# Patient Record
Sex: Female | Born: 1990 | Race: Black or African American | Hispanic: No | Marital: Single | Smoking: Current every day smoker
Health system: Southern US, Community
[De-identification: ages and names within clinical notes are randomized; demographics above are authoritative.]

## PROBLEM LIST (undated history)

## (undated) DIAGNOSIS — F329 Major depressive disorder, single episode, unspecified: Secondary | ICD-10-CM

## (undated) DIAGNOSIS — F319 Bipolar disorder, unspecified: Secondary | ICD-10-CM

## (undated) DIAGNOSIS — F419 Anxiety disorder, unspecified: Secondary | ICD-10-CM

## (undated) DIAGNOSIS — T7840XA Allergy, unspecified, initial encounter: Secondary | ICD-10-CM

## (undated) DIAGNOSIS — F32A Depression, unspecified: Secondary | ICD-10-CM

## (undated) DIAGNOSIS — J45909 Unspecified asthma, uncomplicated: Secondary | ICD-10-CM

## (undated) HISTORY — DX: Allergy, unspecified, initial encounter: T78.40XA

## (undated) HISTORY — DX: Depression, unspecified: F32.A

## (undated) HISTORY — DX: Major depressive disorder, single episode, unspecified: F32.9

## (undated) HISTORY — PX: ABDOMINAL SURGERY: SHX537

## (undated) HISTORY — DX: Bipolar disorder, unspecified: F31.9

---
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
## 2009-05-04 ENCOUNTER — Emergency Department (HOSPITAL_COMMUNITY): Admission: EM | Admit: 2009-05-04 | Discharge: 2009-05-04 | Payer: Self-pay | Admitting: Family Medicine

## 2011-01-16 LAB — POCT RAPID STREP A (OFFICE): Streptococcus, Group A Screen (Direct): POSITIVE — AB

## 2011-06-26 ENCOUNTER — Emergency Department (HOSPITAL_COMMUNITY)
Admission: EM | Admit: 2011-06-26 | Discharge: 2011-06-27 | Disposition: A | Discharge status: Home / Self Care | Payer: Self-pay | Attending: Emergency Medicine | Admitting: Emergency Medicine

## 2011-06-26 DIAGNOSIS — L0231 Cutaneous abscess of buttock: Secondary | ICD-10-CM | POA: Insufficient documentation

## 2011-06-26 DIAGNOSIS — L03317 Cellulitis of buttock: Secondary | ICD-10-CM | POA: Insufficient documentation

## 2011-12-16 ENCOUNTER — Encounter (HOSPITAL_COMMUNITY): Payer: Self-pay

## 2011-12-16 ENCOUNTER — Emergency Department (INDEPENDENT_AMBULATORY_CARE_PROVIDER_SITE_OTHER)
Admission: EM | Admit: 2011-12-16 | Discharge: 2011-12-16 | Disposition: A | Discharge status: Home / Self Care | Payer: Self-pay | Source: Home / Self Care

## 2011-12-16 DIAGNOSIS — J019 Acute sinusitis, unspecified: Secondary | ICD-10-CM

## 2011-12-16 DIAGNOSIS — J209 Acute bronchitis, unspecified: Secondary | ICD-10-CM

## 2011-12-16 MED ORDER — AMOXICILLIN 875 MG PO TABS: 875.0000 mg | ORAL_TABLET | Freq: Two times a day (BID) | ORAL | Status: AC

## 2011-12-16 MED ORDER — PROMETHAZINE-CODEINE 6.25-10 MG/5ML PO SYRP: ORAL_SOLUTION | ORAL | Status: AC

## 2011-12-16 NOTE — ED Provider Notes (Signed)
History     CSN: [id number]  Arrival date & time 12/16/11  1529   None     Chief Complaint  Patient presents with  . URI    (Consider location/radiation/quality/duration/timing/severity/associated sxs/prior treatment) HPI Comments: Patient presents today with complaints of sinus pressure, nasal congestion, cough and fever for the last 6 days. She has also had a headache. She has been taking over-the-counter cold medications and using her sister's inhaler with minimal relief. She denies dyspnea or wheezing.   History reviewed. No pertinent past medical history.  History reviewed. No pertinent past surgical history.  History reviewed. No pertinent family history.  History  Substance Use Topics  . Smoking status: Not on file  . Smokeless tobacco: Not on file  . Alcohol Use: Not on file    OB History    Grav Para Term Preterm Abortions TAB SAB Ect Mult Living                  Review of Systems  Constitutional: Positive for fever, chills and fatigue.  HENT: Positive for congestion, rhinorrhea, postnasal drip and sinus pressure. Negative for ear pain, sore throat and sneezing.   Respiratory: Positive for cough. Negative for shortness of breath and wheezing.   Cardiovascular: Negative for chest pain.  Musculoskeletal: Positive for myalgias.  Neurological: Positive for headaches.    Allergies  Review of patient's allergies indicates no known allergies.  Home Medications   Current Outpatient Rx  Name Route Sig Dispense Refill  . AMOXICILLIN 875 MG PO TABS Oral Take 1 tablet (875 mg total) by mouth 2 (two) times daily. 20 tablet 0  . PROMETHAZINE-CODEINE 6.25-10 MG/5ML PO SYRP  1-2 tsp every 6 hrs prn cough 120 mL 0    BP 127/67  Pulse 81  Temp(Src) 98.8 F (37.1 C) (Oral)  Resp 18  SpO2 98%  LMP 12/15/2011  Physical Exam  Nursing note and vitals reviewed. Constitutional: She appears well-developed and well-nourished. No distress.  HENT:  Head: Normocephalic  and atraumatic.  Right Ear: Tympanic membrane, external ear and ear canal normal.  Left Ear: Tympanic membrane, external ear and ear canal normal.  Nose: Mucosal edema ( nasal turbinates severely swollen and erythematous bilaterally) present.  Mouth/Throat: Uvula is midline, oropharynx is clear and moist and mucous membranes are normal. No oropharyngeal exudate, posterior oropharyngeal edema or posterior oropharyngeal erythema.  Neck: Neck supple.  Cardiovascular: Normal rate, regular rhythm and normal heart sounds.   Pulmonary/Chest: Effort normal and breath sounds normal. No respiratory distress.  Lymphadenopathy:    She has no cervical adenopathy.  Neurological: She is alert.  Skin: Skin is warm and dry.  Psychiatric: She has a normal mood and affect.    ED Course  Procedures (including critical care time)  Labs Reviewed - No data to display No results found.   1. Acute sinusitis   2. Acute bronchitis       MDM          Melody Comas, [place] 12/16/11 1738

## 2011-12-16 NOTE — ED Notes (Signed)
C/o URI type symptoms, cough, congestion, body aches

## 2011-12-16 NOTE — Discharge Instructions (Signed)
Increase fluids and rest. Tylenol or Ibuprofen as needed for discomfort. Do not take the prescription cough medication and drive or operate machinery. You may take an over the counter cough medication during daytime hours. Return if symptoms change or worsen.

## 2011-12-17 NOTE — ED Provider Notes (Signed)
Medical screening examination/treatment/procedure(s) were performed by non-physician practitioner and as supervising physician I was immediately available for consultation/collaboration.  Leslee Home, M.D.   Reuben Likes, MD 12/17/11 [phone]

## 2012-02-16 ENCOUNTER — Encounter (HOSPITAL_COMMUNITY): Payer: Self-pay

## 2012-02-16 ENCOUNTER — Emergency Department (HOSPITAL_COMMUNITY)
Admission: EM | Admit: 2012-02-16 | Discharge: 2012-02-16 | Disposition: A | Discharge status: Home / Self Care | Payer: Self-pay | Attending: Emergency Medicine | Admitting: Emergency Medicine

## 2012-02-16 DIAGNOSIS — R220 Localized swelling, mass and lump, head: Secondary | ICD-10-CM | POA: Insufficient documentation

## 2012-02-16 DIAGNOSIS — J029 Acute pharyngitis, unspecified: Secondary | ICD-10-CM | POA: Insufficient documentation

## 2012-02-16 MED ORDER — IBUPROFEN 200 MG PO TABS
600.0000 mg | ORAL_TABLET | Freq: Once | ORAL | Status: AC
  Administered 2012-02-16: 600 mg via ORAL
  Filled 2012-02-16: qty 3

## 2012-02-16 MED ORDER — IBUPROFEN 600 MG PO TABS: 600.0000 mg | ORAL_TABLET | Freq: Four times a day (QID) | ORAL | Status: AC | PRN

## 2012-02-16 NOTE — Discharge Instructions (Signed)
Antibiotic Nonuse  Your caregiver felt that the infection or problem was not one that would be helped with an antibiotic. Infections may be caused by viruses or bacteria. Only a caregiver can tell which one of these is the likely cause of an illness. A cold is the most common cause of infection in both adults and children. A cold is a virus. Antibiotic treatment will have no effect on a viral infection. Viruses can lead to many lost days of work caring for sick children and many missed days of school. Children may catch as many as 10 "colds" or "flus" per year during which they can be tearful, cranky, and uncomfortable. The goal of treating a virus is aimed at keeping the ill person comfortable. Antibiotics are medications used to help the body fight bacterial infections. There are relatively few types of bacteria that cause infections but there are hundreds of viruses. While both viruses and bacteria cause infection they are very different types of germs. A viral infection will typically go away by itself within 7 to 10 days. Bacterial infections may spread or get worse without antibiotic treatment. Examples of bacterial infections are:  Sore throats (like strep throat or tonsillitis).   Infection in the lung (pneumonia).   Ear and skin infections.  Examples of viral infections are:  Colds or flus.   Most coughs and bronchitis.   Sore throats not caused by Strep.   Runny noses.  It is often best not to take an antibiotic when a viral infection is the cause of the problem. Antibiotics can kill off the helpful bacteria that we have inside our body and allow harmful bacteria to start growing. Antibiotics can cause side effects such as allergies, nausea, and diarrhea without helping to improve the symptoms of the viral infection. Additionally, repeated uses of antibiotics can cause bacteria inside of our body to become resistant. That resistance can be passed onto harmful bacterial. The next time  you have an infection it may be harder to treat if antibiotics are used when they are not needed. Not treating with antibiotics allows our own immune system to develop and take care of infections more efficiently. Also, antibiotics will work better for [place] when they are prescribed for bacterial infections. Treatments for a child that is ill may include:  Give extra fluids throughout the day to stay hydrated.   Get plenty of rest.   Only give your child over-the-counter or prescription medicines for pain, discomfort, or fever as directed by your caregiver.   The use of a cool mist humidifier may help stuffy noses.   Cold medications if suggested by your caregiver.  Your caregiver may decide to start you on an antibiotic if:  The problem you were seen for today continues for a longer length of time than expected.   You develop a secondary bacterial infection.  SEEK MEDICAL CARE IF:  Fever lasts longer than 5 days.   Symptoms continue to get worse after 5 to 7 days or become severe.   Difficulty in breathing develops.   Signs of dehydration develop (poor drinking, rare urinating, dark colored urine).   Changes in behavior or worsening tiredness (listlessness or lethargy).  Document Released: 12/05/2001 Document Revised: 09/15/2011 Document Reviewed: 06/03/2009 [facility] Patient Information 2012 [place], [place].  Pharyngitis, Viral and Bacterial Pharyngitis is soreness (inflammation) or infection of the pharynx. It is also called a sore throat. CAUSES  Most sore throats are caused by viruses and are part of a cold. However,  some sore throats are caused by strep and other bacteria. Sore throats can also be caused by post nasal drip from draining sinuses, allergies and sometimes from sleeping with an open mouth. Infectious sore throats can be spread from person to person by coughing, sneezing and sharing cups or eating utensils. TREATMENT  Sore throats that are viral usually last 3-4  days. Viral illness will get better without medications (antibiotics). Strep throat and other bacterial infections will usually begin to get better about 24-48 hours after you begin to take antibiotics. HOME CARE INSTRUCTIONS   If the caregiver feels there is a bacterial infection or if there is a positive strep test, they will prescribe an antibiotic. The full course of antibiotics must be taken. If the full course of antibiotic is not taken, you or your child may become ill again. If you or your child has strep throat and do not finish all of the medication, serious heart or kidney diseases may develop.   Drink enough water and fluids to keep your urine clear or pale yellow.   Only take over-the-counter or prescription medicines for pain, discomfort or fever as directed by your caregiver.   Get lots of rest.   Gargle with salt water ( tsp. of salt in a glass of water) as often as every 1-2 hours as you need for comfort.   Hard candies may soothe the throat if individual is not at risk for choking. Throat sprays or lozenges may also be used.  SEEK MEDICAL CARE IF:   Large, tender lumps in the neck develop.   A rash develops.   Green, yellow-brown or bloody sputum is coughed up.   Your baby is older than 3 months with a rectal temperature of 100.5 F (38.1 C) or higher for more than 1 day.  SEEK IMMEDIATE MEDICAL CARE IF:   A stiff neck develops.   You or your child are drooling or unable to swallow liquids.   You or your child are vomiting, unable to keep medications or liquids down.   You or your child has severe pain, unrelieved with recommended medications.   You or your child are having difficulty breathing (not due to stuffy nose).   You or your child are unable to fully open your mouth.   You or your child develop redness, swelling, or severe pain anywhere on the neck.   You have a fever.   Your baby is older than 3 months with a rectal temperature of 102 F (38.9  C) or higher.   Your baby is 50 months old or younger with a rectal temperature of 100.4 F (38 C) or higher.  MAKE SURE YOU:   Understand these instructions.   Will watch your condition.   Will get help right away if you are not doing well or get worse.  Document Released: 09/26/2005 Document Revised: 09/15/2011 Document Reviewed: 12/24/2007 [facility] Patient Information 2012 [place], [place].  RESOURCE GUIDE  Dental Problems  Patients with Medicaid: [facility] [phone]                                           [phone] Phone:  [phone]  Phone:  [phone]  If unable to pay or uninsured, contact:  [facility] or [facility]. to become qualified for the adult dental clinic.  Chronic Pain Problems Contact [facility]  [phone] Patients need to be referred by their primary care doctor.  Insufficient Money for Medicine Contact [employer] Way:  call "211" or [facility] ([phone].  No Primary Care Doctor Call [facility]  [phone] Other agencies that provide inexpensive medical care    [facility]  [phone]    [facility]  [phone]    [facility]  [phone]    [facility]  [phone]    [facility]  [phone]    [facility]  [phone]  Psychological Services [facility]  [phone] [facility]  [phone] [facility]   [phone] (emergency services [phone])  Abuse/Neglect [facility] Child Abuse Hotline [phone] [facility] Child Abuse Hotline 2072265223 (After Hours)  Emergency Shelter [facility] [phone]  [facility] [phone] [facility] [phone]  MRSA Hotline #:   726 556 9065    [facility]  Resources  [facility]  [employer]                           [facility] Dept. [address] [place]                     [address]         [address]  Blondell Reveal Phone:  [phone]                                  Phone:  [phone]                   Phone:  [phone]  [facility] Phone:  [phone]  [facility] Child Abuse Hotline 215-396-6652 [phone] (After Hours)

## 2012-02-16 NOTE — ED Notes (Signed)
Patient states onset one month ago seen Doctor placed on antibiotics for cough sore throat states not feeling better recently sore throat 6/10 headache 6/10 throbbing. Airway intact bilateral equal chest rise and fall. No distress noted.

## 2012-02-16 NOTE — ED Notes (Signed)
Sore throat, cough congested,  And headache,  Also can't breathe at night,

## 2012-02-16 NOTE — ED Provider Notes (Signed)
History   This chart was scribed for Forbes Cellar, MD by Melba Coon. The patient was seen in room STRE5/STRE5 and the patient's care was started at 11:15PM.    CSN: [id number]  Arrival date & time 02/16/12  1051   First MD Initiated Contact with Patient 02/16/12 1102      Chief Complaint  Patient presents with  . Sore Throat    (Consider location/radiation/quality/duration/timing/severity/associated sxs/prior treatment) HPI Brandi Ball is a 21 y.o. female who presents to the Emergency Department complaining of constant, moderate to severe sore throat with an onset 3 days ago. Pt states that it hurts to swallow; had a fever of 101 yesterday but none today. No OTC meds taken at home. Had similar symptoms last month; was given abx and was fine. At night, nasal congestion and cough present. Mild frontal HA present. No neck pain, rash, back pain, SOB, abd pain, n/v/d, dysuria, or extremity pain, edema, weakness, numbness, or tingling. No known allergies. No other pertinent medical symptoms.   History reviewed. No pertinent past medical history.  Past Surgical History  Procedure Date  . Abdominal surgery     No family history on file.  History  Substance Use Topics  . Smoking status: Current Everyday Smoker  . Smokeless tobacco: Not on file  . Alcohol Use: Yes    OB History    Grav Para Term Preterm Abortions TAB SAB Ect Mult Living                  Review of Systems 10 Systems reviewed and all are negative for acute change except as noted in the HPI.   Allergies  Review of patient's allergies indicates no known allergies.  Home Medications  No current outpatient prescriptions on file.  BP 111/72  Pulse 89  Temp(Src) 98.1 F (36.7 C) (Oral)  Resp 12  SpO2 100%  LMP 02/06/2012  Physical Exam  Nursing note and vitals reviewed. Constitutional: She is oriented to person, place, and time. She appears well-developed and well-nourished. No distress.  HENT:    Head: Normocephalic and atraumatic.       1+ tonsillar swelling with mild erythema; no trismus No exudates Uvula midline  Eyes: EOM are normal.  Neck: Neck supple. No tracheal deviation present.  Cardiovascular: Normal rate.   No murmur heard. Pulmonary/Chest: Effort normal. No respiratory distress. She has no wheezes. She has no rales.  Abdominal: Soft. There is no tenderness.  Musculoskeletal: Normal range of motion. She exhibits no tenderness.  Lymphadenopathy:    She has no cervical adenopathy.  Neurological: She is alert and oriented to person, place, and time.  Skin: Skin is warm and dry. No erythema.  Psychiatric: She has a normal mood and affect. Her behavior is normal.    ED Course  Procedures (including critical care time)  DIAGNOSTIC STUDIES: Oxygen Saturation is 100% on room air, normal by my interpretation.    COORDINATION OF CARE:  11:19PM - EDMD will order ibuprofen and strep test for the pt; EDMD also took a sample of back of pt throat and will order culture.     Labs Reviewed  RAPID STREP SCREEN  STREP B DNA PROBE   No results found.   1. Pharyngitis       MDM  Pharyngitis without apparent complications. Rapid strep negative. Sent for culture. Ibuprofen. Tolerating PO. No EMC precluding discharge at this time. Given Precautions for return. PMD f/u.  I personally performed the services described in this documentation,  which was scribed in my presence. The recorded information has been reviewed and considered.        Forbes Cellar, MD 02/16/12 [phone]

## 2013-05-08 ENCOUNTER — Encounter (HOSPITAL_COMMUNITY): Payer: Self-pay | Admitting: Emergency Medicine

## 2013-05-08 ENCOUNTER — Emergency Department (INDEPENDENT_AMBULATORY_CARE_PROVIDER_SITE_OTHER)
Admission: EM | Admit: 2013-05-08 | Discharge: 2013-05-08 | Disposition: A | Discharge status: Home / Self Care | Payer: Self-pay | Source: Home / Self Care | Attending: Emergency Medicine | Admitting: Emergency Medicine

## 2013-05-08 DIAGNOSIS — S335XXA Sprain of ligaments of lumbar spine, initial encounter: Secondary | ICD-10-CM

## 2013-05-08 LAB — POCT URINALYSIS DIP (DEVICE)
Bilirubin Urine: NEGATIVE
Specific Gravity, Urine: 1.02 (ref 1.005–1.030)
pH: 7 (ref 5.0–8.0)

## 2013-05-08 MED ORDER — HYDROCODONE-IBUPROFEN 7.5-200 MG PO TABS: 1.0000 | ORAL_TABLET | Freq: Three times a day (TID) | ORAL | Status: DC | PRN

## 2013-05-08 MED ORDER — CYCLOBENZAPRINE HCL 10 MG PO TABS: 10.0000 mg | ORAL_TABLET | Freq: Three times a day (TID) | ORAL | Status: DC | PRN

## 2013-05-08 MED ORDER — HYDROCODONE-ACETAMINOPHEN 5-325 MG PO TABS
ORAL_TABLET | ORAL | Status: AC
  Filled 2013-05-08: qty 1

## 2013-05-08 MED ORDER — HYDROCODONE-ACETAMINOPHEN 5-325 MG PO TABS
1.0000 | ORAL_TABLET | Freq: Once | ORAL | Status: AC
  Administered 2013-05-08: 1 via ORAL

## 2013-05-08 NOTE — ED Provider Notes (Signed)
CSN: [id number]     Arrival date & time 05/08/13  1237 History     First MD Initiated Contact with Patient 05/08/13 1335     Chief Complaint  Patient presents with  . Back Pain   (Consider location/radiation/quality/duration/timing/severity/associated sxs/prior Treatment) HPI Comments: Patient presents urgent care this afternoon complaining that since Sunday she's been expressing lower back pain that somewhat shoots up to her middle back. She woke up with this pain Sunday denies any recent injury or trauma such as recent falls or increased physical activity. She does however describes it she used to be a runner and has experienced similar pains in the past. For the last 2 days she's been taking ibuprofen, as well as Aleve with no significant improvement. Pain is exacerbated with activity movements especially leaning forward.  Patient denies any numbness or tingling sensations whenever lower extremities. Denies any changes in her urination or bowel movement patterns. Denies constitutional symptoms such as fevers, abdominal pain or unintentional weight loss.  Patient is a 22 y.o. female presenting with back pain. The history is provided by the patient.  Back Pain Location:  Lumbar spine Quality:  Aching Radiates to:  Does not radiate Pain severity:  Moderate Pain is:  Same all the time Onset quality:  Gradual Timing:  Constant Progression:  Worsening Chronicity:  New Context: not recent illness and not recent injury   Relieved by:  Nothing Worsened by:  Movement, standing, palpation and touching Ineffective treatments:  None tried Associated symptoms: no abdominal pain, no bladder incontinence, no dysuria, no fever, no headaches, no leg pain, no numbness, no paresthesias, no pelvic pain, no perianal numbness, no tingling, no weakness and no weight loss     History reviewed. No pertinent past medical history. Past Surgical History  Procedure Laterality Date  . Abdominal surgery      History reviewed. No pertinent family history. History  Substance Use Topics  . Smoking status: Current Every Day Smoker  . Smokeless tobacco: Not on file  . Alcohol Use: Yes   OB History   Grav Para Term Preterm Abortions TAB SAB Ect Mult Living                 Review of Systems  Constitutional: Positive for activity change. Negative for fever, chills, weight loss, appetite change and fatigue.  Gastrointestinal: Negative for abdominal pain.  Genitourinary: Negative for bladder incontinence, dysuria, flank pain and pelvic pain.  Musculoskeletal: Positive for back pain. Negative for myalgias, joint swelling, arthralgias and gait problem.  Skin: Negative for color change, rash and wound.  Neurological: Negative for tingling, weakness, numbness, headaches and paresthesias.    Allergies  Review of patient's allergies indicates no known allergies.  Home Medications   Current Outpatient Rx  Name  Route  Sig  Dispense  Refill  . cyclobenzaprine (FLEXERIL) 10 MG tablet   Oral   Take 1 tablet (10 mg total) by mouth 3 (three) times daily as needed for muscle spasms.   20 tablet   0   . HYDROcodone-ibuprofen (VICOPROFEN) 7.5-200 MG per tablet   Oral   Take 1 tablet by mouth every 8 (eight) hours as needed for pain.   15 tablet   0    BP 113/72  Pulse 78  Temp(Src) 97.9 F (36.6 C) (Oral)  Resp 16  SpO2 100%  LMP 04/28/2013 Physical Exam  Nursing note and vitals reviewed. Constitutional: Vital signs are normal. She appears well-developed and well-nourished.  Non-toxic appearance. She does  not have a sickly appearance. She does not appear ill. No distress.  HENT:  Head: Normocephalic.  Eyes: No scleral icterus.  Neck: Neck supple.  Pulmonary/Chest: Effort normal and breath sounds normal.  Musculoskeletal: She exhibits tenderness.       Lumbar back: She exhibits decreased range of motion, tenderness and pain. She exhibits no bony tenderness, no swelling, no edema, no  deformity and no laceration.       Back:  Neurological: She is alert.  Skin: No rash noted. No erythema.    ED Course   Procedures (including critical care time)  Labs Reviewed  POCT URINALYSIS DIP (DEVICE) - Abnormal; Notable for the following:    Hgb urine dipstick TRACE (*)    Leukocytes, UA TRACE (*)    All other components within normal limits  POCT PREGNANCY, URINE   No results found. 1. Lumbar back sprain, initial encounter     MDM  Lumbar sprain/strain. (  Non-trauma)  Plan of care:  Patient has been encouraged to take the prescribed medicines for the next 3-5 days including a muscle relaxer. She has been also instructed to stretch her back with provided written exercises. Have discussed with patient what specific symptoms should wire for further evaluation in the emergency department as well as discussed that if her pain persisted beyond 7-10 days or worsens that she should return for further evaluation or followup with the orthopedic Dr. She agrees with treatment plan and followup care. A work note was provided for the next 48 hours with some restrictions for the next 72 hours  New Prescriptions   CYCLOBENZAPRINE (FLEXERIL) 10 MG TABLET    Take 1 tablet (10 mg total) by mouth 3 (three) times daily as needed for muscle spasms.   HYDROCODONE-IBUPROFEN (VICOPROFEN) 7.5-200 MG PER TABLET    Take 1 tablet by mouth every 8 (eight) hours as needed for pain.  .edp  Jimmie Molly, MD 05/08/13 1430

## 2013-05-08 NOTE — ED Notes (Signed)
Patient states she has lower back pain radiating to mid back since Sunday.  Pain medications taking and stretch as a therapy but no relief.  Patient states that she had back pain while running track at school but no major pain like she is in right now.

## 2013-05-08 NOTE — ED Notes (Signed)
Patient states she does have someone to take her home

## 2013-07-19 ENCOUNTER — Emergency Department (INDEPENDENT_AMBULATORY_CARE_PROVIDER_SITE_OTHER)
Admission: EM | Admit: 2013-07-19 | Discharge: 2013-07-19 | Disposition: A | Discharge status: Home / Self Care | Payer: Self-pay | Source: Home / Self Care | Attending: Family Medicine | Admitting: Family Medicine

## 2013-07-19 ENCOUNTER — Encounter (HOSPITAL_COMMUNITY): Payer: Self-pay | Admitting: Emergency Medicine

## 2013-07-19 DIAGNOSIS — K0889 Other specified disorders of teeth and supporting structures: Secondary | ICD-10-CM

## 2013-07-19 DIAGNOSIS — K089 Disorder of teeth and supporting structures, unspecified: Secondary | ICD-10-CM

## 2013-07-19 MED ORDER — DICLOFENAC POTASSIUM 50 MG PO TABS: 50.0000 mg | ORAL_TABLET | Freq: Three times a day (TID) | ORAL | Status: DC

## 2013-07-19 MED ORDER — CLINDAMYCIN HCL 150 MG PO CAPS: 150.0000 mg | ORAL_CAPSULE | Freq: Four times a day (QID) | ORAL | Status: DC

## 2013-07-19 NOTE — ED Notes (Signed)
Triage process interrupted by department acuity

## 2013-07-19 NOTE — ED Provider Notes (Signed)
CSN: [id number]     Arrival date & time 07/19/13  0845 History   First MD Initiated Contact with Patient 07/19/13 [phone]     Chief Complaint  Patient presents with  . Dental Pain   (Consider location/radiation/quality/duration/timing/severity/associated sxs/prior Treatment) Patient is a 22 y.o. female presenting with tooth pain. The history is provided by the patient.  Dental Pain Location:  Upper Upper teeth location:  15/LU 2nd molar Quality:  Aching and sharp Severity:  Mild Onset quality:  Gradual Duration:  3 days Progression:  Worsening Chronicity:  New Context: dental caries and poor dentition   Relieved by:  None tried Worsened by:  Nothing tried Ineffective treatments:  None tried Associated symptoms: facial pain   Associated symptoms: no facial swelling and no fever   Risk factors: lack of dental care     History reviewed. No pertinent past medical history. Past Surgical History  Procedure Laterality Date  . Abdominal surgery     No family history on file. History  Substance Use Topics  . Smoking status: Current Every Day Smoker  . Smokeless tobacco: Not on file  . Alcohol Use: Yes   OB History   Grav Para Term Preterm Abortions TAB SAB Ect Mult Living                 Review of Systems  Constitutional: Negative.  Negative for fever.  HENT: Positive for dental problem. Negative for facial swelling.     Allergies  Review of patient's allergies indicates no known allergies.  Home Medications   Current Outpatient Rx  Name  Route  Sig  Dispense  Refill  . clindamycin (CLEOCIN) 150 MG capsule   Oral   Take 1 capsule (150 mg total) by mouth 4 (four) times daily.   28 capsule   0   . cyclobenzaprine (FLEXERIL) 10 MG tablet   Oral   Take 1 tablet (10 mg total) by mouth 3 (three) times daily as needed for muscle spasms.   20 tablet   0   . diclofenac (CATAFLAM) 50 MG tablet   Oral   Take 1 tablet (50 mg total) by mouth 3 (three) times daily. For  dental pain   15 tablet   0   . HYDROcodone-ibuprofen (VICOPROFEN) 7.5-200 MG per tablet   Oral   Take 1 tablet by mouth every 8 (eight) hours as needed for pain.   15 tablet   0    BP 117/63  Pulse 67  Temp(Src) 98.5 F (36.9 C) (Oral)  Resp 18  SpO2 100%  LMP 06/28/2013 Physical Exam  Nursing note and vitals reviewed. Constitutional: She appears well-developed and well-nourished.  HENT:  Right Ear: External ear normal.  Left Ear: External ear normal.  Mouth/Throat: Oropharynx is clear and moist.      ED Course  Procedures (including critical care time) Labs Review Labs Reviewed - No data to display Imaging Review No results found.  EKG Interpretation     Ventricular Rate:    PR Interval:    QRS Duration:   QT Interval:    QTC Calculation:   R Axis:     Text Interpretation:              MDM      Linna Hoff, MD 07/19/13 [phone]

## 2013-07-19 NOTE — ED Notes (Signed)
Left top tooth has a hole in tooth, swelling and pain

## 2013-07-19 NOTE — ED Notes (Signed)
Discussed [facility], [facility]

## 2014-06-06 ENCOUNTER — Emergency Department (HOSPITAL_COMMUNITY): Payer: Self-pay

## 2014-06-06 ENCOUNTER — Encounter (HOSPITAL_COMMUNITY): Payer: Self-pay | Admitting: Emergency Medicine

## 2014-06-06 ENCOUNTER — Emergency Department (HOSPITAL_COMMUNITY)
Admission: EM | Admit: 2014-06-06 | Discharge: 2014-06-06 | Disposition: A | Discharge status: Home / Self Care | Payer: Self-pay | Attending: Emergency Medicine | Admitting: Emergency Medicine

## 2014-06-06 DIAGNOSIS — R05 Cough: Secondary | ICD-10-CM

## 2014-06-06 DIAGNOSIS — F17200 Nicotine dependence, unspecified, uncomplicated: Secondary | ICD-10-CM | POA: Insufficient documentation

## 2014-06-06 DIAGNOSIS — Z79899 Other long term (current) drug therapy: Secondary | ICD-10-CM | POA: Insufficient documentation

## 2014-06-06 DIAGNOSIS — Z792 Long term (current) use of antibiotics: Secondary | ICD-10-CM | POA: Insufficient documentation

## 2014-06-06 DIAGNOSIS — Z3202 Encounter for pregnancy test, result negative: Secondary | ICD-10-CM | POA: Insufficient documentation

## 2014-06-06 DIAGNOSIS — J069 Acute upper respiratory infection, unspecified: Secondary | ICD-10-CM | POA: Diagnosis present

## 2014-06-06 DIAGNOSIS — R059 Cough, unspecified: Secondary | ICD-10-CM | POA: Diagnosis present

## 2014-06-06 DIAGNOSIS — R509 Fever, unspecified: Secondary | ICD-10-CM | POA: Insufficient documentation

## 2014-06-06 DIAGNOSIS — R079 Chest pain, unspecified: Secondary | ICD-10-CM | POA: Insufficient documentation

## 2014-06-06 LAB — BASIC METABOLIC PANEL
Anion gap: 12 (ref 5–15)
BUN: 9 mg/dL (ref 6–23)
CHLORIDE: 103 meq/L (ref 96–112)
CO2: 24 meq/L (ref 19–32)
Calcium: 9.6 mg/dL (ref 8.4–10.5)
Creatinine, Ser: 0.91 mg/dL (ref 0.50–1.10)
GFR, EST NON AFRICAN AMERICAN: 87 mL/min — AB (ref >90)
GLUCOSE: 112 mg/dL — AB (ref 70–99)
POTASSIUM: 3.6 meq/L — AB (ref 3.7–5.3)
SODIUM: 139 meq/L (ref 137–147)

## 2014-06-06 LAB — CBC
HCT: 35.9 % — ABNORMAL LOW (ref 36.0–46.0)
Hemoglobin: 12 g/dL (ref 12.0–15.0)
MCH: 28.8 pg (ref 26.0–34.0)
MCHC: 33.4 g/dL (ref 30.0–36.0)
MCV: 86.1 fL (ref 78.0–100.0)
Platelets: 246 10*3/uL (ref 150–400)
RBC: 4.17 MIL/uL (ref 3.87–5.11)
RDW: 14.4 % (ref 11.5–15.5)
WBC: 5.9 10*3/uL (ref 4.0–10.5)

## 2014-06-06 LAB — I-STAT TROPONIN, ED: Troponin i, poc: 0 ng/mL (ref 0.00–0.08)

## 2014-06-06 LAB — POC URINE PREG, ED: Preg Test, Ur: NEGATIVE

## 2014-06-06 MED ORDER — BENZONATATE 100 MG PO CAPS
100.0000 mg | ORAL_CAPSULE | Freq: Once | ORAL | Status: AC
  Administered 2014-06-06: 100 mg via ORAL
  Filled 2014-06-06: qty 1

## 2014-06-06 MED ORDER — BENZONATATE 100 MG PO CAPS: 100.0000 mg | ORAL_CAPSULE | Freq: Three times a day (TID) | ORAL | Status: DC

## 2014-06-06 MED ORDER — HYDROCOD POLST-CHLORPHEN POLST 10-8 MG/5ML PO LQCR: 5.0000 mL | Freq: Every evening | ORAL | Status: DC | PRN

## 2014-06-06 MED ORDER — BENZONATATE 100 MG PO CAPS
100.0000 mg | ORAL_CAPSULE | Freq: Once | ORAL | Status: DC
  Filled 2014-06-06: qty 1

## 2014-06-06 NOTE — ED Provider Notes (Signed)
I have personally seen and examined the patient.  I have discussed the plan of care with the resident.  I have reviewed the documentation on PMH/FH/Soc. History.  I have reviewed the documentation of the resident and agree.  Pt well appearing, no distress, lung sounds clear, appropriate for d/c home Pt agreeable with plan   Joya Gaskins, MD 06/06/14 [phone]

## 2014-06-06 NOTE — ED Notes (Signed)
Pt. Reports cough for 3 weeks with chest pain, SOB and nausea and fever (103 at home).

## 2014-06-06 NOTE — Discharge Instructions (Signed)
Cough, Adult   A cough is a reflex. It helps you clear your throat and airways. A cough can help heal your body. A cough can last 2 or 3 weeks (acute) or may last more than 8 weeks (chronic). Some common causes of a cough can include an infection, allergy, or a cold.  HOME CARE  · Only take medicine as told by your doctor.  · If given, take your medicines (antibiotics) as told. Finish them even if you start to feel better.  · Use a cold steam vaporizer or humidifier in your home. This can help loosen thick spit (secretions).  · Sleep so you are almost sitting up (semi-upright). Use pillows to do this. This helps reduce coughing.  · Rest as needed.  · Stop smoking if you smoke.  GET HELP RIGHT AWAY IF:  · You have yellowish-white fluid (pus) in your thick spit.  · Your cough gets worse.  · Your medicine does not reduce coughing, and you are losing sleep.  · You cough up blood.  · You have trouble breathing.  · Your pain gets worse and medicine does not help.  · You have a fever.  MAKE SURE YOU:   · Understand these instructions.  · Will watch your condition.  · Will get help right away if you are not doing well or get worse.  Document Released: 06/09/2011 Document Revised: 02/10/2014 Document Reviewed: 06/09/2011  ExitCare® Patient Information ©2015 [employer]. This information is not intended to replace advice given to you by your health care provider. Make sure you discuss any questions you have with your health care provider.

## 2014-06-06 NOTE — ED Notes (Signed)
EKG completed in triage.

## 2014-06-06 NOTE — ED Notes (Signed)
Patient discharged with all personal belongings. 

## 2014-06-06 NOTE — ED Provider Notes (Signed)
CSN: [id number]     Arrival date & time 06/06/14  1505 History   First MD Initiated Contact with Patient 06/06/14 1838     Chief Complaint  Patient presents with  . Cough  . Chest Pain     (Consider location/radiation/quality/duration/timing/severity/associated sxs/prior Treatment) Patient is a 23 y.o. female presenting with cough. The history is provided by the patient.  Cough Cough characteristics:  Non-productive Severity:  Severe Onset quality:  Gradual Duration:  3 weeks Timing:  Constant Progression:  Worsening Chronicity:  New Smoker: yes   Context: upper respiratory infection   Context: not animal exposure   Relieved by:  Nothing Worsened by:  Nothing tried Ineffective treatments:  Cough suppressants Associated symptoms: fever, rhinorrhea and sinus congestion   Associated symptoms: no chest pain, no chills, no diaphoresis, no ear fullness, no ear pain, no eye discharge, no headaches, no myalgias, no shortness of breath, no sore throat and no wheezing     History reviewed. No pertinent past medical history. Past Surgical History  Procedure Laterality Date  . Abdominal surgery     No family history on file. History  Substance Use Topics  . Smoking status: Current Every Day Smoker  . Smokeless tobacco: Not on file  . Alcohol Use: Yes   OB History   Grav Para Term Preterm Abortions TAB SAB Ect Mult Living                 Review of Systems  Constitutional: Positive for fever. Negative for chills, diaphoresis, activity change and appetite change.  HENT: Positive for rhinorrhea. Negative for drooling, ear pain, sneezing, sore throat and trouble swallowing.   Eyes: Negative for discharge and redness.  Respiratory: Positive for cough. Negative for chest tightness, shortness of breath, wheezing and stridor.   Cardiovascular: Negative for chest pain and leg swelling.  Gastrointestinal: Negative for nausea, vomiting, abdominal pain, diarrhea, constipation and blood in  stool.  Genitourinary: Negative for difficulty urinating.  Musculoskeletal: Negative for arthralgias and myalgias.  Skin: Negative for pallor.  Neurological: Negative for dizziness, syncope, speech difficulty, weakness, light-headedness and headaches.  Hematological: Negative for adenopathy. Does not bruise/bleed easily.  Psychiatric/Behavioral: Negative for confusion and agitation.      Allergies  Review of patient's allergies indicates no known allergies.  Home Medications   Prior to Admission medications   Medication Sig Start Date End Date Taking? Authorizing Provider  benzonatate (TESSALON) 100 MG capsule Take 1 capsule (100 mg total) by mouth every 8 (eight) hours. 06/06/14   Sena Hitch, MD  chlorpheniramine-HYDROcodone [facility] PENNKINETIC ER) 10-8 MG/5ML LQCR Take 5 mLs by mouth at bedtime as needed for cough. 06/06/14   Sena Hitch, MD  clindamycin (CLEOCIN) 150 MG capsule Take 1 capsule (150 mg total) by mouth 4 (four) times daily. 07/19/13   Linna Hoff, MD  cyclobenzaprine (FLEXERIL) 10 MG tablet Take 1 tablet (10 mg total) by mouth 3 (three) times daily as needed for muscle spasms. 05/08/13   Jimmie Molly, MD  diclofenac (CATAFLAM) 50 MG tablet Take 1 tablet (50 mg total) by mouth 3 (three) times daily. For dental pain 07/19/13   Linna Hoff, MD  HYDROcodone-ibuprofen (VICOPROFEN) 7.5-200 MG per tablet Take 1 tablet by mouth every 8 (eight) hours as needed for pain. 05/08/13   Jimmie Molly, MD   BP 111/57  Pulse 72  Temp(Src) 98.7 F (37.1 C) (Oral)  Resp 18  SpO2 100%  LMP 05/13/2014 Physical Exam  Constitutional: She is oriented to  person, place, and time. She appears well-developed and well-nourished. No distress.  HENT:  Head: Normocephalic and atraumatic.  Right Ear: External ear normal.  Left Ear: External ear normal.  Eyes: Conjunctivae and EOM are normal. Right eye exhibits no discharge. Left eye exhibits no discharge.  Neck: Normal range of motion.  Neck supple. No JVD present.  Cardiovascular: Normal rate, regular rhythm and normal heart sounds.  Exam reveals no gallop and no friction rub.   No murmur heard. Pulmonary/Chest: Effort normal and breath sounds normal. No stridor. No respiratory distress. She has no wheezes. She has no rales. She exhibits no tenderness.  Abdominal: Soft. Bowel sounds are normal. She exhibits no distension. There is no tenderness. There is no rebound and no guarding.  Musculoskeletal: Normal range of motion. She exhibits no edema.  Neurological: She is alert and oriented to person, place, and time.  Skin: Skin is warm. No rash noted. She is not diaphoretic.  Psychiatric: She has a normal mood and affect. Her behavior is normal.    ED Course  Procedures (including critical care time) Labs Review Labs Reviewed  CBC - Abnormal; Notable for the following:    HCT 35.9 (*)    All other components within normal limits  BASIC METABOLIC PANEL - Abnormal; Notable for the following:    Potassium 3.6 (*)    Glucose, Bld 112 (*)    GFR calc non Af Amer 87 (*)    All other components within normal limits  POC URINE PREG, ED  Rosezena Sensor, ED    Imaging Review Dg Chest 2 View  06/06/2014   CLINICAL DATA:  Three weeks of cough  EXAM: CHEST  2 VIEW  COMPARISON:  02/27/2007  FINDINGS: The heart size and mediastinal contours are within normal limits. Both lungs are clear. The visualized skeletal structures are unremarkable.  IMPRESSION: No active cardiopulmonary disease.   Electronically Signed   By: Alcide Clever M.D.   On: 06/06/2014 16:19     EKG Interpretation   Date/Time:  Friday June 06 2014 15:17:36 EDT Ventricular Rate:  78 PR Interval:  146 QRS Duration: 80 QT Interval:  336 QTC Calculation: 383 R Axis:   79 Text Interpretation:  Sinus bradycardia Non-specific ST-t changes artifact  noted No previous ECGs available Confirmed by Bebe Shaggy  MD, Dorinda Hill ([id number])  on 06/06/2014 7:40:58 PM      MDM    Final diagnoses:  URI (upper respiratory infection)  Cough    Pt presents with URI and cough. Non productive dry cough. No fever. Not on estrogen. No plane/car rides. No family history of VTE. No chest pain or SOB. Well low risk. PERC neg. Oropharynx clear. FROM of the neck. Uvula midline. Doubt PTA or RPA. Likely URI. No sick contacts. No children or exposures to potential pertussis. Supportive care at home. Gave tessalon here. Gave rx for tessalon and Tussionex. Strong return precautions given for worsening symptoms or any other alarming or concerning symptoms or issues. The patient was in agreement with the treatment plan and I answered all of their questions. The patient was stable for dc. At dc, the patient ambulated without difficulty, was moving all four extremities, symptoms improved, NAD. and AOx4 Care discussed with my attending, Dr. Bebe Shaggy. If performed and available, imaging studies and labs reviewed.    Sena Hitch, MD 06/06/14 2015

## 2015-03-29 ENCOUNTER — Inpatient Hospital Stay (HOSPITAL_COMMUNITY)
Admission: AD | Admit: 2015-03-29 | Discharge: 2015-03-29 | Disposition: A | Discharge status: Home / Self Care | Payer: Self-pay | Source: Ambulatory Visit | Attending: Obstetrics & Gynecology | Admitting: Obstetrics & Gynecology

## 2015-03-29 ENCOUNTER — Encounter (HOSPITAL_COMMUNITY): Payer: Self-pay

## 2015-03-29 DIAGNOSIS — IMO0001 Reserved for inherently not codable concepts without codable children: Secondary | ICD-10-CM

## 2015-03-29 DIAGNOSIS — F419 Anxiety disorder, unspecified: Secondary | ICD-10-CM | POA: Insufficient documentation

## 2015-03-29 DIAGNOSIS — F17210 Nicotine dependence, cigarettes, uncomplicated: Secondary | ICD-10-CM | POA: Insufficient documentation

## 2015-03-29 DIAGNOSIS — N911 Secondary amenorrhea: Secondary | ICD-10-CM | POA: Insufficient documentation

## 2015-03-29 DIAGNOSIS — R030 Elevated blood-pressure reading, without diagnosis of hypertension: Secondary | ICD-10-CM | POA: Insufficient documentation

## 2015-03-29 HISTORY — DX: Unspecified asthma, uncomplicated: J45.909

## 2015-03-29 LAB — URINALYSIS, ROUTINE W REFLEX MICROSCOPIC
Bilirubin Urine: NEGATIVE
GLUCOSE, UA: NEGATIVE mg/dL
Ketones, ur: NEGATIVE mg/dL
LEUKOCYTES UA: NEGATIVE
Nitrite: NEGATIVE
PROTEIN: NEGATIVE mg/dL
SPECIFIC GRAVITY, URINE: 1.02 (ref 1.005–1.030)
UROBILINOGEN UA: 0.2 mg/dL (ref 0.0–1.0)
pH: 7 (ref 5.0–8.0)

## 2015-03-29 LAB — WET PREP, GENITAL
Trich, Wet Prep: NONE SEEN
Yeast Wet Prep HPF POC: NONE SEEN

## 2015-03-29 LAB — URINE MICROSCOPIC-ADD ON

## 2015-03-29 LAB — TSH: TSH: 0.901 u[IU]/mL (ref 0.350–4.500)

## 2015-03-29 LAB — POCT PREGNANCY, URINE: PREG TEST UR: NEGATIVE

## 2015-03-29 NOTE — Discharge Instructions (Signed)
Secondary Amenorrhea  Secondary amenorrhea is the stopping of menstrual flow for 3-6 months in a female who has previously had periods. There are many possible causes. Most of these causes are not serious. Usually, treating the underlying problem causing the loss of menses will return your periods to normal. CAUSES  Some common and uncommon causes of not menstruating include:  Malnutrition.  Low blood sugar (hypoglycemia).  Polycystic ovary disease.  Stress or fear.  Breastfeeding.  Hormone imbalance.  Ovarian failure.  Medicines.  Extreme obesity.  Cystic fibrosis.  Low body weight or drastic weight reduction from any cause.  Early menopause.  Removal of ovaries or uterus.  Contraceptives.  Illness.  Long-term (chronic) illnesses.  Cushing syndrome.  Thyroid problems.  Birth control pills, patches, or vaginal rings for birth control. RISK FACTORS You may be at greater risk of secondary amenorrhea if:  You have a family history of this condition.  You have an eating disorder.  You do athletic training. DIAGNOSIS  A diagnosis is made by your health care provider taking a medical history and doing a physical exam. This will include a pelvic exam to check for problems with your reproductive organs. Pregnancy must be ruled out. Often, numerous blood tests are done to measure different hormones in the body. Urine testing may be done. Specialized exams (ultrasound, CT scan, MRI, or hysteroscopy) may have to be done as well as measuring the body mass index (BMI). TREATMENT  Treatment depends on the cause of the amenorrhea. If an eating disorder is present, this can be treated with an adequate diet and therapy. Chronic illnesses may improve with treatment of the illness. Amenorrhea may be corrected with medicines, lifestyle changes, or surgery. If the amenorrhea cannot be corrected, it is sometimes possible to create a false menstruation with medicines. HOME CARE  INSTRUCTIONS  Maintain a healthy diet.  Manage weight problems.  Exercise regularly but not excessively.  Get adequate sleep.  Manage stress.  Be aware of changes in your menstrual cycle. Keep a record of when your periods occur. Note the date your period starts, how long it lasts, and any problems. SEEK MEDICAL CARE IF: Your symptoms do not get better with treatment. Document Released: 11/07/2006 Document Revised: 05/29/2013 Document Reviewed: 03/14/2013 ExitCare Patient Information 2015 [employer]. This information is not intended to replace advice given to you by your health care provider. Make sure you discuss any questions you have with your health care provider.  

## 2015-03-29 NOTE — MAU Note (Signed)
Pt presents to MAU with complaints of irregular cycles. Had a normal cycle in January and hasnt had one since. Home pregnany test negative

## 2015-03-29 NOTE — MAU Provider Note (Signed)
History     CSN: [id number]  Arrival date and time: 03/29/15 1053   First Provider Initiated Contact with Patient 03/29/15 1210      Chief Complaint  Patient presents with  . Metrorrhagia   HPI Comments: Brandi Ball is a 24 y.o. Nulligravida presenting with secondary amenorrhea of 5 months duration. LMP 10/24/2014. States she had always had regular menses at monthly intervals prior to that. No history abnormal bleeding. Single sex partner for several months. Denies malodorous or irritative vaginal discharge but gives history of frequent Trichomonas in the past. Does not want to start contraception. Anxiety/depression worse the last few days. Medications include Latuda which was started 08/2014. Did not take in March. Denies any history of chronic hypertension.       Past Medical History  Diagnosis Date  . Asthma     childhood  Denies abnormal Pap or GYN conditions  Past Surgical History  Procedure Laterality Date  . Abdominal surgery      History reviewed. No pertinent family history.  History  Substance Use Topics  . Smoking status: Current Every Day Smoker  . Smokeless tobacco: Not on file  . Alcohol Use: Yes    Allergies: No Known Allergies  Prescriptions prior to admission  Medication Sig Dispense Refill Last Dose  . CALCIUM PO Take 1 tablet by mouth daily.   Past Week at Unknown time  . fluticasone (FLONASE) 50 MCG/ACT nasal spray Place 1 spray into both nostrils daily.   Past Month at Unknown time  . lurasidone (LATUDA) 40 MG TABS tablet Take 40 mg by mouth daily with breakfast.   03/28/2015 at Unknown time  . NAPROXEN PO Take 1-2 tablets by mouth daily as needed (migraines).   03/28/2015 at Unknown time  . pseudoephedrine-acetaminophen (TYLENOL SINUS) 30-500 MG TABS Take 1 tablet by mouth every 4 (four) hours as needed (allergies).   Past Week at Unknown time  . Vitamin D, Ergocalciferol, (DRISDOL) 50000 UNITS CAPS capsule Take 50,000 Units by mouth every 7  (seven) days.   Past Week at Unknown time  . benzonatate (TESSALON) 100 MG capsule Take 1 capsule (100 mg total) by mouth every 8 (eight) hours. (Patient not taking: Reported on 03/29/2015) 21 capsule 0   . chlorpheniramine-HYDROcodone (TUSSIONEX PENNKINETIC ER) 10-8 MG/5ML LQCR Take 5 mLs by mouth at bedtime as needed for cough. (Patient not taking: Reported on 03/29/2015) 115 mL 0   . clindamycin (CLEOCIN) 150 MG capsule Take 1 capsule (150 mg total) by mouth 4 (four) times daily. (Patient not taking: Reported on 03/29/2015) 28 capsule 0   . cyclobenzaprine (FLEXERIL) 10 MG tablet Take 1 tablet (10 mg total) by mouth 3 (three) times daily as needed for muscle spasms. (Patient not taking: Reported on 03/29/2015) 20 tablet 0 Unknown at Unknown time  . diclofenac (CATAFLAM) 50 MG tablet Take 1 tablet (50 mg total) by mouth 3 (three) times daily. For dental pain (Patient not taking: Reported on 03/29/2015) 15 tablet 0   . HYDROcodone-ibuprofen (VICOPROFEN) 7.5-200 MG per tablet Take 1 tablet by mouth every 8 (eight) hours as needed for pain. (Patient not taking: Reported on 03/29/2015) 15 tablet 0 Unknown at Unknown time    Review of Systems  Constitutional: Negative for fever.  HENT: Negative for hearing loss.        Denies cold temperature intolerance, neck mass.  Eyes: Positive for blurred vision. Negative for double vision.  Respiratory: Negative for cough.   Cardiovascular: Negative for chest pain.  Gastrointestinal: Negative for nausea, vomiting, abdominal pain and blood in stool.       Had an incidence of fleeting sharp suprapubic abdominal pain 5 days ago and last night. Having pain at present.  Genitourinary: Negative for dysuria, urgency, frequency, hematuria and flank pain.  Musculoskeletal: Negative for myalgias, back pain and neck pain.  Neurological: Negative for headaches.  Psychiatric/Behavioral: Positive for depression. Negative for suicidal ideas and substance abuse. The patient is  nervous/anxious.    Physical Exam   Blood pressure 146/78, pulse 88, temperature 98.2 F (36.8 C), resp. rate 18, height 5\' 4"  (1.626 m), weight 93.895 kg (207 lb), last menstrual period 10/24/2014.  Physical Exam  Nursing note and vitals reviewed. Constitutional: She is oriented to person, place, and time.  Mildly obese  Neck: Normal range of motion. Neck supple. No tracheal deviation present. No thyromegaly present.  Genitourinary: Uterus normal. Vaginal discharge found.  Moderate white homogenous discharge  Musculoskeletal: Normal range of motion.  Neurological: She is alert and oriented to person, place, and time.  Skin: Skin is warm and dry.  No hirsutism noted    MAU Course  Procedures Results for orders placed or performed during the hospital encounter of 03/29/15 (from the past 24 hour(s))  Urinalysis, Routine w reflex microscopic (not at [facility])     Status: Abnormal   Collection Time: 03/29/15 11:00 AM  Result Value Ref Range   Color, Urine YELLOW YELLOW   APPearance CLEAR CLEAR   Specific Gravity, Urine 1.020 1.005 - 1.030   pH 7.0 5.0 - 8.0   Glucose, UA NEGATIVE NEGATIVE mg/dL   Hgb urine dipstick MODERATE (A) NEGATIVE   Bilirubin Urine NEGATIVE NEGATIVE   Ketones, ur NEGATIVE NEGATIVE mg/dL   Protein, ur NEGATIVE NEGATIVE mg/dL   Urobilinogen, UA 0.2 0.0 - 1.0 mg/dL   Nitrite NEGATIVE NEGATIVE   Leukocytes, UA NEGATIVE NEGATIVE  Urine microscopic-add on     Status: Abnormal   Collection Time: 03/29/15 11:00 AM  Result Value Ref Range   Squamous Epithelial / LPF FEW (A) RARE   WBC, UA 0-2 <3 WBC/hpf   RBC / HPF 0-2 <3 RBC/hpf   Bacteria, UA FEW (A) RARE   Urine-Other MUCOUS PRESENT   Pregnancy, urine POC     Status: None   Collection Time: 03/29/15 11:23 AM  Result Value Ref Range   Preg Test, Ur NEGATIVE NEGATIVE  Wet prep, genital     Status: Abnormal   Collection Time: 03/29/15 12:20 PM  Result Value Ref Range   Yeast Wet Prep HPF POC NONE SEEN NONE  SEEN   Trich, Wet Prep NONE SEEN NONE SEEN   Clue Cells Wet Prep HPF POC FEW (A) NONE SEEN   WBC, Wet Prep HPF POC FEW (A) NONE SEEN  GC/CT, HIV, TSH, prolactin sent   Assessment and Plan   1. Secondary amenorrhea   2. Elevated BP    Follow-up Information    Follow up with [facility].   Specialty:  Obstetrics and Gynecology   Why:  Someone from Clinic will call you with appt.   Contact information:   [address] [place] [address] [phone]      POE,DEIRDRE 03/29/2015, 12:32 PM

## 2015-03-30 LAB — PROLACTIN: Prolactin: 21 ng/mL (ref 4.8–23.3)

## 2015-03-30 LAB — HIV ANTIBODY (ROUTINE TESTING W REFLEX): HIV Screen 4th Generation wRfx: NONREACTIVE

## 2015-03-30 LAB — GC/CHLAMYDIA PROBE AMP (~~LOC~~) NOT AT ARMC
Chlamydia: NEGATIVE
NEISSERIA GONORRHEA: NEGATIVE

## 2015-04-27 ENCOUNTER — Encounter: Payer: Self-pay | Admitting: Obstetrics & Gynecology

## 2015-04-27 ENCOUNTER — Ambulatory Visit (INDEPENDENT_AMBULATORY_CARE_PROVIDER_SITE_OTHER): Payer: Self-pay | Admitting: Obstetrics & Gynecology

## 2015-04-27 VITALS — BP 130/71 | HR 78 | Temp 98.9°F | Ht 64.0 in | Wt 207.7 lb

## 2015-04-27 DIAGNOSIS — N926 Irregular menstruation, unspecified: Secondary | ICD-10-CM | POA: Insufficient documentation

## 2015-04-27 MED ORDER — MEDROXYPROGESTERONE ACETATE 10 MG PO TABS: 10.0000 mg | ORAL_TABLET | Freq: Every day | ORAL | Status: DC

## 2015-04-27 NOTE — Patient Instructions (Signed)
Secondary Amenorrhea  Secondary amenorrhea is the stopping of menstrual flow for 3-6 months in a female who has previously had periods. There are many possible causes. Most of these causes are not serious. Usually, treating the underlying problem causing the loss of menses will return your periods to normal. CAUSES  Some common and uncommon causes of not menstruating include:  Malnutrition.  Low blood sugar (hypoglycemia).  Polycystic ovary disease.  Stress or fear.  Breastfeeding.  Hormone imbalance.  Ovarian failure.  Medicines.  Extreme obesity.  Cystic fibrosis.  Low body weight or drastic weight reduction from any cause.  Early menopause.  Removal of ovaries or uterus.  Contraceptives.  Illness.  Long-term (chronic) illnesses.  Cushing syndrome.  Thyroid problems.  Birth control pills, patches, or vaginal rings for birth control. RISK FACTORS You may be at greater risk of secondary amenorrhea if:  You have a family history of this condition.  You have an eating disorder.  You do athletic training. DIAGNOSIS  A diagnosis is made by your health care provider taking a medical history and doing a physical exam. This will include a pelvic exam to check for problems with your reproductive organs. Pregnancy must be ruled out. Often, numerous blood tests are done to measure different hormones in the body. Urine testing may be done. Specialized exams (ultrasound, CT scan, MRI, or hysteroscopy) may have to be done as well as measuring the body mass index (BMI). TREATMENT  Treatment depends on the cause of the amenorrhea. If an eating disorder is present, this can be treated with an adequate diet and therapy. Chronic illnesses may improve with treatment of the illness. Amenorrhea may be corrected with medicines, lifestyle changes, or surgery. If the amenorrhea cannot be corrected, it is sometimes possible to create a false menstruation with medicines. HOME CARE  INSTRUCTIONS  Maintain a healthy diet.  Manage weight problems.  Exercise regularly but not excessively.  Get adequate sleep.  Manage stress.  Be aware of changes in your menstrual cycle. Keep a record of when your periods occur. Note the date your period starts, how long it lasts, and any problems. SEEK MEDICAL CARE IF: Your symptoms do not get better with treatment. Document Released: 11/07/2006 Document Revised: 05/29/2013 Document Reviewed: 03/14/2013 ExitCare Patient Information 2015 [employer]. This information is not intended to replace advice given to you by your health care provider. Make sure you discuss any questions you have with your health care provider.  

## 2015-04-27 NOTE — Progress Notes (Signed)
Patient ID: Brandi BuergerDaphnie Ball, female   DOB: [birth year], 24 y.o.   MRN: [id number]  Chief Complaint  Patient presents with  . Amenorrhea    last period in January  cc:no menses for 5 mo, LMP 04/05/15  HPI Brandi Ball is a 24 y.o. female.  G0P0000 LMP 04/05/15. Seen in MAU 6/15 with amenorrhea since Jan, had spontaneous menses after that. Not seeking pregnancy in same sex relationship. Previous regular menses. Stopped Latuda 2 mo ago   HPI  Past Medical History  Diagnosis Date  . Asthma     childhood  . Bipolar 1 disorder   . Depression     Past Surgical History  Procedure Laterality Date  . Abdominal surgery      No family history on file.  Social History History  Substance Use Topics  . Smoking status: Current Every Day Smoker  . Smokeless tobacco: Never Used  . Alcohol Use: Yes    No Known Allergies  Current Outpatient Prescriptions  Medication Sig Dispense Refill  . medroxyPROGESTERone (PROVERA) 10 MG tablet Take 1 tablet (10 mg total) by mouth daily. Use for ten days 10 tablet 2   No current facility-administered medications for this visit.    Review of Systems Review of Systems  Constitutional: Positive for unexpected weight change (30 lb weight gain 12 mo).  Respiratory: Negative.   Genitourinary: Negative for vaginal bleeding, vaginal discharge, menstrual problem and pelvic pain.    Blood pressure 130/71, pulse 78, temperature 98.9 F (37.2 C), temperature source Oral, height 5\' 4"  (1.626 m), weight 207 lb 11.2 oz (94.212 kg), last menstrual period 10/24/2014. Body mass index is 35.63 kg/(m^2).  Physical Exam Physical Exam  Constitutional: Brandi Ball is oriented to person, place, and time. Brandi Ball appears well-developed. No distress.  Cardiovascular: Normal rate.   Pulmonary/Chest: Effort normal. No respiratory distress.  Neurological: Brandi Ball is alert and oriented to person, place, and time.  Psychiatric: Brandi Ball has a normal mood and affect. Her behavior is normal.   Vitals reviewed.   Data Reviewed MAU note  Assessment    Irregular menses suspect anovulation Obesity     Plan    Advise wgt loss Provera challenge rx sent for PRN use is menses few weeks late, RTC 6 mo Prn    20 min face to face and coordination of care    ARNOLD,JAMES 04/27/2015, 4:44 PM

## 2015-09-07 ENCOUNTER — Encounter (HOSPITAL_COMMUNITY): Payer: Self-pay

## 2015-09-07 ENCOUNTER — Inpatient Hospital Stay (HOSPITAL_COMMUNITY)
Admission: AD | Admit: 2015-09-07 | Discharge: 2015-09-07 | Disposition: A | Discharge status: Home / Self Care | Payer: Self-pay | Source: Ambulatory Visit | Attending: Family Medicine | Admitting: Family Medicine

## 2015-09-07 DIAGNOSIS — B9689 Other specified bacterial agents as the cause of diseases classified elsewhere: Secondary | ICD-10-CM

## 2015-09-07 DIAGNOSIS — N83201 Unspecified ovarian cyst, right side: Secondary | ICD-10-CM

## 2015-09-07 DIAGNOSIS — Z7982 Long term (current) use of aspirin: Secondary | ICD-10-CM | POA: Insufficient documentation

## 2015-09-07 DIAGNOSIS — N83209 Unspecified ovarian cyst, unspecified side: Secondary | ICD-10-CM | POA: Insufficient documentation

## 2015-09-07 DIAGNOSIS — N760 Acute vaginitis: Secondary | ICD-10-CM | POA: Insufficient documentation

## 2015-09-07 DIAGNOSIS — A499 Bacterial infection, unspecified: Secondary | ICD-10-CM

## 2015-09-07 LAB — CBC
HEMATOCRIT: 38.8 % (ref 36.0–46.0)
Hemoglobin: 12.9 g/dL (ref 12.0–15.0)
MCH: 28.8 pg (ref 26.0–34.0)
MCHC: 33.2 g/dL (ref 30.0–36.0)
MCV: 86.6 fL (ref 78.0–100.0)
PLATELETS: 274 10*3/uL (ref 150–400)
RBC: 4.48 MIL/uL (ref 3.87–5.11)
RDW: 14 % (ref 11.5–15.5)
WBC: 7.4 10*3/uL (ref 4.0–10.5)

## 2015-09-07 LAB — URINE MICROSCOPIC-ADD ON: Bacteria, UA: NONE SEEN

## 2015-09-07 LAB — URINALYSIS, ROUTINE W REFLEX MICROSCOPIC
Bilirubin Urine: NEGATIVE
Glucose, UA: NEGATIVE mg/dL
KETONES UR: NEGATIVE mg/dL
Leukocytes, UA: NEGATIVE
Nitrite: NEGATIVE
PROTEIN: NEGATIVE mg/dL
Specific Gravity, Urine: 1.025 (ref 1.005–1.030)
pH: 6 (ref 5.0–8.0)

## 2015-09-07 LAB — WET PREP, GENITAL
Sperm: NONE SEEN
Trich, Wet Prep: NONE SEEN
Yeast Wet Prep HPF POC: NONE SEEN

## 2015-09-07 LAB — POCT PREGNANCY, URINE: Preg Test, Ur: NEGATIVE

## 2015-09-07 MED ORDER — METRONIDAZOLE 500 MG PO TABS: 500.0000 mg | ORAL_TABLET | Freq: Three times a day (TID) | ORAL | Status: DC

## 2015-09-07 NOTE — MAU Note (Signed)
Pt C/O severe lower abd pain, also back & vaginal pain that started on 11/26.  Had spotting also on the 26th, became heavier the next day.

## 2015-09-07 NOTE — MAU Provider Note (Signed)
  History    G1P0010 in with c/o low abd pain since Saturday. 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 Also started menses over the weekend. States uses condoms as birth control. CSN: [id number]  Arrival date and time: 09/07/15 1234   None     Chief Complaint  Patient presents with  . Abdominal Pain  . Back Pain  . Vaginal Bleeding   HPI  OB History    Gravida Para Term Preterm AB TAB SAB Ectopic Multiple Living   1 0 0 0 1 0 1 0 0 0       Past Medical History  Diagnosis Date  . Asthma     childhood  . Bipolar 1 disorder (HCC)   . Depression     Past Surgical History  Procedure Laterality Date  . Abdominal surgery      History reviewed. No pertinent family history.  Social History  Substance Use Topics  . Smoking status: Current Every Day Smoker  . Smokeless tobacco: Never Used  . Alcohol Use: Yes    Allergies: No Known Allergies  Prescriptions prior to admission  Medication Sig Dispense Refill Last Dose  . aspirin-acetaminophen-caffeine (EXCEDRIN MIGRAINE) 250-250-65 MG tablet Take 2 tablets by mouth every 6 (six) hours as needed for headache (pain).   09/06/2015 at Unknown time  . Multiple Vitamins-Minerals (MULTIVITAMIN PO) Take 1 tablet by mouth daily.   09/06/2015 at Unknown time  . medroxyPROGESTERone (PROVERA) 10 MG tablet Take 1 tablet (10 mg total) by mouth daily. Use for ten days (Patient not taking: Reported on 09/07/2015) 10 tablet 2     Review of Systems  Constitutional: Negative.   HENT: Negative.   Eyes: Negative.   Respiratory: Negative.   Cardiovascular: Negative.   Gastrointestinal: Positive for abdominal pain.  Genitourinary: Negative.   Musculoskeletal: Negative.   Skin: Negative.   Neurological: Negative.   Endo/Heme/Allergies: Negative.   Psychiatric/Behavioral: Negative.    Physical Exam    Blood pressure 135/78, pulse 79, temperature 98 F (36.7 C), temperature source Oral, resp. rate 18, last menstrual period 09/06/2015.  Physical Exam  Constitutional: She is oriented to person, place, and time. She appears well-developed and well-nourished.  HENT:  Head: Normocephalic.  Eyes: Pupils are equal, round, and reactive to light.  Neck: Normal range of motion.  Cardiovascular: Normal rate, regular rhythm, normal heart sounds and intact distal pulses.   Respiratory: Effort normal and breath sounds normal.  GI: Soft. Bowel sounds are normal. There is tenderness.  Genitourinary: Vagina normal and uterus normal.  Musculoskeletal: Normal range of motion.  Neurological: She is alert and oriented to person, place, and time. She has normal reflexes.  Skin: Skin is warm and dry.  Psychiatric: She has a normal mood and affect. Her behavior is normal. Judgment and thought content normal.    MAU Course  Procedures  MDM Ovarian cyst BV  Assessment and Plan  Sterile spec exam done, cultures and wet wet obtrained. Sl tenderness with bumanual exam.rupt ovarian cyst. Wet prep shows BV. Will treat with flagyl declines ocps to control ovarian cust. .Sherolyn Trettin DARLENE 09/07/2015, 3:54 PM

## 2015-09-07 NOTE — Discharge Instructions (Signed)

## 2015-09-08 LAB — HIV ANTIBODY (ROUTINE TESTING W REFLEX): HIV SCREEN 4TH GENERATION: NONREACTIVE

## 2015-09-08 LAB — GC/CHLAMYDIA PROBE AMP (~~LOC~~) NOT AT ARMC
Chlamydia: NEGATIVE
Neisseria Gonorrhea: NEGATIVE

## 2015-09-08 LAB — RPR: RPR: NONREACTIVE

## 2016-06-23 ENCOUNTER — Encounter (HOSPITAL_COMMUNITY): Payer: Self-pay

## 2016-06-23 ENCOUNTER — Emergency Department (HOSPITAL_COMMUNITY)
Admission: EM | Admit: 2016-06-23 | Discharge: 2016-06-23 | Disposition: A | Discharge status: Home / Self Care | Payer: BLUE CROSS/BLUE SHIELD | Attending: Emergency Medicine | Admitting: Emergency Medicine

## 2016-06-23 DIAGNOSIS — T7840XA Allergy, unspecified, initial encounter: Secondary | ICD-10-CM | POA: Diagnosis present

## 2016-06-23 DIAGNOSIS — J45909 Unspecified asthma, uncomplicated: Secondary | ICD-10-CM | POA: Insufficient documentation

## 2016-06-23 DIAGNOSIS — L500 Allergic urticaria: Secondary | ICD-10-CM | POA: Diagnosis not present

## 2016-06-23 DIAGNOSIS — F17210 Nicotine dependence, cigarettes, uncomplicated: Secondary | ICD-10-CM | POA: Diagnosis not present

## 2016-06-23 DIAGNOSIS — Z79899 Other long term (current) drug therapy: Secondary | ICD-10-CM | POA: Diagnosis not present

## 2016-06-23 DIAGNOSIS — Z7982 Long term (current) use of aspirin: Secondary | ICD-10-CM | POA: Diagnosis not present

## 2016-06-23 MED ORDER — FAMOTIDINE 20 MG PO TABS: 20.0000 mg | ORAL_TABLET | Freq: Two times a day (BID) | ORAL | 0 refills | Status: DC

## 2016-06-23 MED ORDER — HYDROXYZINE HCL 25 MG PO TABS
25.0000 mg | ORAL_TABLET | Freq: Every evening | ORAL | 0 refills | Status: DC | PRN
Start: 2016-06-23 — End: 2016-07-24

## 2016-06-23 MED ORDER — DIPHENHYDRAMINE HCL 25 MG PO TABS: 25.0000 mg | ORAL_TABLET | Freq: Four times a day (QID) | ORAL | 0 refills | Status: DC

## 2016-06-23 MED ORDER — DEXAMETHASONE SODIUM PHOSPHATE 10 MG/ML IJ SOLN
10.0000 mg | Freq: Once | INTRAMUSCULAR | Status: AC
  Administered 2016-06-23: 10 mg via INTRAMUSCULAR
  Filled 2016-06-23: qty 1

## 2016-06-23 MED ORDER — ALBUTEROL SULFATE HFA 108 (90 BASE) MCG/ACT IN AERS: 2.0000 | INHALATION_SPRAY | RESPIRATORY_TRACT | 1 refills | Status: AC | PRN

## 2016-06-23 NOTE — ED Triage Notes (Signed)
Woke up scratching, hives all over around 9:15 pm last night. Benadryl was taken 50mg , with no relieve Airway patent,

## 2016-06-23 NOTE — ED Provider Notes (Signed)
[facility]-EMERGENCY DEPT Provider Note   CSN: [id number] Arrival date & time: 06/23/16  1152  By signing my name below, I, Placido SouLogan Joldersma, attest that this documentation has been prepared under the direction and in the presence of Ramel Tobon C. Neysha Criado, PA-C. Electronically Signed: Placido SouLogan Joldersma, ED Scribe. 06/23/16. 1:02 PM.   History   Chief Complaint Chief Complaint  Patient presents with  . Allergic Reaction    HPI HPI Comments: Brandi Ball is a 25 y.o. female who presents to the Emergency Department complaining of worsening, mild, diffuse rash to her hands and feet which began last night around 9:00 PM. She states her symptoms progressively worsened and spread across her legs, torso and back. She reports associated burning/itchiness and mild wheezing but states she wheezes at baseline and doesn't feel as if it has worsened since the onset of her rash. Pt took two benadryl this morning which provided mild relief. Pt denies having visualized any insects prior to her rash or any recent contacts with similar symptoms. Pt reports a h/o asthma but states she hasn't used an inhaler or experienced severe exacerbations since she was a child. No other associated symptoms at this time.   The history is provided by the patient. No language interpreter was used.    Past Medical History:  Diagnosis Date  . Asthma    childhood  . Bipolar 1 disorder (HCC)   . Depression     Patient Active Problem List   Diagnosis Date Noted  . Irregular menstrual cycle 04/27/2015  . Cough 06/06/2014  . URI (upper respiratory infection) 06/06/2014    Past Surgical History:  Procedure Laterality Date  . ABDOMINAL SURGERY      OB History    Gravida Para Term Preterm AB Living   1 0 0 0 1 0   SAB TAB Ectopic Multiple Live Births   1 0 0 0         Home Medications    Prior to Admission medications   Medication Sig Start Date End Date Taking? Authorizing Provider  albuterol (PROVENTIL HFA;VENTOLIN HFA)  108 (90 Base) MCG/ACT inhaler Inhale 2 puffs into the lungs every 4 (four) hours as needed for wheezing or shortness of breath. 06/23/16   Kielee Care C Izick Gasbarro, PA-C  aspirin-acetaminophen-caffeine (EXCEDRIN MIGRAINE) [phone] MG tablet Take 2 tablets by mouth every 6 (six) hours as needed for headache (pain).    Historical Provider, MD  diphenhydrAMINE (BENADRYL) 25 MG tablet Take 1 tablet (25 mg total) by mouth every 6 (six) hours. 06/23/16 06/26/16  Arvell Pulsifer C Hutson Luft, PA-C  famotidine (PEPCID) 20 MG tablet Take 1 tablet (20 mg total) by mouth 2 (two) times daily. 06/23/16 06/28/16  Deric Bocock C Willene Holian, PA-C  hydrOXYzine (ATARAX/VISTARIL) 25 MG tablet Take 1 tablet (25 mg total) by mouth at bedtime as needed for itching. 06/23/16   Matthieu Loftus C Roseland Braun, PA-C  metroNIDAZOLE (FLAGYL) 500 MG tablet Take 1 tablet (500 mg total) by mouth 3 (three) times daily. 09/07/15   Montez MoritaMarie D Lawson, CNM  Multiple Vitamins-Minerals (MULTIVITAMIN PO) Take 1 tablet by mouth daily.    Historical Provider, MD    Family History No family history on file.  Social History Social History  Substance Use Topics  . Smoking status: Current Every Day Smoker    Packs/day: 0.50    Types: Cigarettes  . Smokeless tobacco: Never Used  . Alcohol use Yes     Allergies   Review of patient's allergies indicates no known allergies.   Review  of Systems Review of Systems  Respiratory: Positive for wheezing (present at baseline). Negative for shortness of breath.   Skin: Positive for color change and rash.  All other systems reviewed and are negative.  Physical Exam Updated Vital Signs BP 114/58 (BP Location: Right Arm)   Pulse 80   Temp 98.8 F (37.1 C)   Resp 18   LMP 06/02/2016   SpO2 100%   Physical Exam  Constitutional: She appears well-developed and well-nourished. No distress.  HENT:  Head: Normocephalic and atraumatic.  Eyes: Conjunctivae are normal.  Neck: Neck supple.  Cardiovascular: Normal rate, regular rhythm, normal heart sounds  and intact distal pulses.   Pulmonary/Chest: Effort normal. No respiratory distress. She has wheezes.  No difficulty speaking or breathing. Faint wheezes noted in the bilateral lower lobes. Pt confirms this is baseline for her.   Abdominal: Soft. There is no tenderness. There is no guarding.  Musculoskeletal: She exhibits no edema or tenderness.  Lymphadenopathy:    She has no cervical adenopathy.  Neurological: She is alert.  Skin: Skin is warm and dry. Rash noted. She is not diaphoretic. There is erythema.  Hives covering both arms, back and legs.   Psychiatric: She has a normal mood and affect. Her behavior is normal.  Nursing note and vitals reviewed.  ED Treatments / Results  Labs (all labs ordered are listed, but only abnormal results are displayed) Labs Reviewed - No data to display  EKG  EKG Interpretation None       Radiology No results found.  Procedures Procedures  DIAGNOSTIC STUDIES: Oxygen Saturation is 100% on RA, normal by my interpretation.    COORDINATION OF CARE: 12:59 PM PM Discussed next steps with pt. Pt verbalized understanding and is agreeable with the plan.    Medications Ordered in ED Medications  dexamethasone (DECADRON) injection 10 mg (10 mg Intramuscular Given 06/23/16 1315)     Initial Impression / Assessment and Plan / ED Course  I have reviewed the triage vital signs and the nursing notes.  Pertinent labs & imaging results that were available during my care of the patient were reviewed by me and considered in my medical decision making (see chart for details).  Clinical Course   Patient with signs of allergic reaction. No physical deep breathing or acute distress. Appropriate treatment and return precautions discussed.   Vitals:   06/23/16 1214 06/23/16 1320  BP:  114/58  Pulse: 84 80  Resp: 18 18  Temp: 98.8 F (37.1 C)   SpO2: 100% 100%      Final Clinical Impressions(s) / ED Diagnoses   Final diagnoses:  Allergic  reaction, initial encounter    New Prescriptions New Prescriptions   ALBUTEROL (PROVENTIL HFA;VENTOLIN HFA) 108 (90 BASE) MCG/ACT INHALER    Inhale 2 puffs into the lungs every 4 (four) hours as needed for wheezing or shortness of breath.   DIPHENHYDRAMINE (BENADRYL) 25 MG TABLET    Take 1 tablet (25 mg total) by mouth every 6 (six) hours.   FAMOTIDINE (PEPCID) 20 MG TABLET    Take 1 tablet (20 mg total) by mouth 2 (two) times daily.   HYDROXYZINE (ATARAX/VISTARIL) 25 MG TABLET    Take 1 tablet (25 mg total) by mouth at bedtime as needed for itching.     Anselm Pancoast, PA-C 06/23/16 1321    Lavera Guise, MD 06/23/16 2035

## 2016-06-23 NOTE — Discharge Instructions (Signed)
Your symptoms seem to be caused by possible allergic reaction. Take 25 mg of Benadryl every 6 hours for the next 3 days. Take 20 mg of Pepcid twice a day for the next 5 days. Use the albuterol inhaler as needed for wheezing. Take the hydroxyzine at night as needed for itching. Use caution with the Benadryl and the hydroxyzine as these can cause drowsiness. Return to the ED should symptoms worsen. Follow-up with an allergist to figure out what could have caused this reaction.

## 2016-07-24 ENCOUNTER — Inpatient Hospital Stay (HOSPITAL_COMMUNITY)
Admission: AD | Admit: 2016-07-24 | Discharge: 2016-07-24 | Disposition: A | Discharge status: Home / Self Care | Payer: BLUE CROSS/BLUE SHIELD | Source: Ambulatory Visit | Attending: Obstetrics and Gynecology | Admitting: Obstetrics and Gynecology

## 2016-07-24 ENCOUNTER — Encounter (HOSPITAL_COMMUNITY): Payer: Self-pay | Admitting: *Deleted

## 2016-07-24 DIAGNOSIS — N390 Urinary tract infection, site not specified: Secondary | ICD-10-CM | POA: Insufficient documentation

## 2016-07-24 DIAGNOSIS — F17210 Nicotine dependence, cigarettes, uncomplicated: Secondary | ICD-10-CM | POA: Insufficient documentation

## 2016-07-24 DIAGNOSIS — N3001 Acute cystitis with hematuria: Secondary | ICD-10-CM | POA: Diagnosis not present

## 2016-07-24 DIAGNOSIS — R319 Hematuria, unspecified: Secondary | ICD-10-CM | POA: Diagnosis present

## 2016-07-24 DIAGNOSIS — Z7982 Long term (current) use of aspirin: Secondary | ICD-10-CM | POA: Insufficient documentation

## 2016-07-24 LAB — POCT PREGNANCY, URINE: PREG TEST UR: NEGATIVE

## 2016-07-24 LAB — URINALYSIS, ROUTINE W REFLEX MICROSCOPIC
Bilirubin Urine: NEGATIVE
GLUCOSE, UA: NEGATIVE mg/dL
Ketones, ur: NEGATIVE mg/dL
Nitrite: NEGATIVE
PROTEIN: 100 mg/dL — AB
Specific Gravity, Urine: >1.03 — ABNORMAL HIGH (ref 1.005–1.030)
pH: 6 (ref 5.0–8.0)

## 2016-07-24 LAB — WET PREP, GENITAL
Clue Cells Wet Prep HPF POC: NONE SEEN
Sperm: NONE SEEN
TRICH WET PREP: NONE SEEN
Yeast Wet Prep HPF POC: NONE SEEN

## 2016-07-24 LAB — URINE MICROSCOPIC-ADD ON

## 2016-07-24 MED ORDER — NITROFURANTOIN MONOHYD MACRO 100 MG PO CAPS: 100.0000 mg | ORAL_CAPSULE | Freq: Two times a day (BID) | ORAL | 0 refills | Status: DC

## 2016-07-24 NOTE — Discharge Instructions (Signed)

## 2016-07-24 NOTE — MAU Note (Signed)
Pt reports difficulty urinating and blood in urine tonight. Also reports urinary frequency.

## 2016-07-24 NOTE — MAU Provider Note (Signed)
History   [id number]   Chief Complaint  Patient presents with  . Hematuria    HPI Brandi Ball is a 25 y.o. female  G1P0010 here with report of urinary frequency and blood tonight.  +dysuria x 2 days.  Denies fever, body aches, or chills.  No report of flank pain.  +white vaginal discharge two days ago, without itching or odor.    Patient's last menstrual period was 06/30/2016.  OB History  Gravida Para Term Preterm AB Living  1 0 0 0 1 0  SAB TAB Ectopic Multiple Live Births  1 0 0 0      # Outcome Date GA Lbr Len/2nd Weight Sex Delivery Anes PTL Lv  1 SAB               Past Medical History:  Diagnosis Date  . Asthma    childhood  . Bipolar 1 disorder (HCC)   . Depression     No family history on file.  Social History   Social History  . Marital status: Single    Spouse name: N/A  . Number of children: N/A  . Years of education: N/A   Social History Main Topics  . Smoking status: Current Every Day Smoker    Packs/day: 0.50    Types: Cigarettes  . Smokeless tobacco: Never Used  . Alcohol use Yes  . Drug use: No  . Sexual activity: Yes    Birth control/ protection: None   Other Topics Concern  . None   Social History Narrative  . None    No Known Allergies  No current facility-administered medications on file prior to encounter.    Current Outpatient Prescriptions on File Prior to Encounter  Medication Sig Dispense Refill  . albuterol (PROVENTIL HFA;VENTOLIN HFA) 108 (90 Base) MCG/ACT inhaler Inhale 2 puffs into the lungs every 4 (four) hours as needed for wheezing or shortness of breath. 1 Inhaler 1  . aspirin-acetaminophen-caffeine (EXCEDRIN MIGRAINE) 250-250-65 MG tablet Take 2 tablets by mouth every 6 (six) hours as needed for headache (pain).    . Multiple Vitamins-Minerals (MULTIVITAMIN PO) Take 1 tablet by mouth daily.    . diphenhydrAMINE (BENADRYL) 25 MG tablet Take 1 tablet (25 mg total) by mouth every 6 (six) hours. 12 tablet 0  .  famotidine (PEPCID) 20 MG tablet Take 1 tablet (20 mg total) by mouth 2 (two) times daily. 10 tablet 0  . hydrOXYzine (ATARAX/VISTARIL) 25 MG tablet Take 1 tablet (25 mg total) by mouth at bedtime as needed for itching. 8 tablet 0  . metroNIDAZOLE (FLAGYL) 500 MG tablet Take 1 tablet (500 mg total) by mouth 3 (three) times daily. 14 tablet 0     Review of Systems  Constitutional: Negative for chills and fever.  Gastrointestinal: Negative for nausea and vomiting.  Genitourinary: Positive for dysuria, frequency, hematuria and vaginal discharge. Negative for flank pain, pelvic pain and vaginal bleeding.  All other systems reviewed and are negative.    Physical Exam   Vitals:   07/24/16 2228  BP: 137/82  Pulse: 87  Resp: 19  Temp: 98.6 F (37 C)  TempSrc: Oral  SpO2: 100%  Weight: 202 lb (91.6 kg)  Height: 5\' 3"  (1.6 m)    Physical Exam  Constitutional: She is oriented to person, place, and time. She appears well-developed and well-nourished. No distress.  HENT:  Head: Normocephalic.  Neck: Normal range of motion. Neck supple.  Cardiovascular: Normal rate, regular rhythm and normal heart sounds.  Respiratory: Effort normal and breath sounds normal. No respiratory distress.  GI: There is no CVA tenderness.  Genitourinary: No bleeding in the vagina. No vaginal discharge found.  Musculoskeletal: Normal range of motion. She exhibits no edema.  Neurological: She is alert and oriented to person, place, and time. She has normal reflexes.  Skin: Skin is warm and dry.    MAU Course  Procedures Results for orders placed or performed during the hospital encounter of 07/24/16 (from the past 24 hour(s))  Urinalysis, Routine w reflex microscopic ([facility])     Status: Abnormal   Collection Time: 07/24/16 10:29 PM  Result Value Ref Range   Color, Urine YELLOW YELLOW   APPearance CLEAR CLEAR   Specific Gravity, Urine >1.030 (H) 1.005 - 1.030   pH 6.0 5.0 - 8.0   Glucose, UA  NEGATIVE NEGATIVE mg/dL   Hgb urine dipstick LARGE (A) NEGATIVE   Bilirubin Urine NEGATIVE NEGATIVE   Ketones, ur NEGATIVE NEGATIVE mg/dL   Protein, ur 161100 (A) NEGATIVE mg/dL   Nitrite NEGATIVE NEGATIVE   Leukocytes, UA MODERATE (A) NEGATIVE  Urine microscopic-add on     Status: Abnormal   Collection Time: 07/24/16 10:29 PM  Result Value Ref Range   Squamous Epithelial / LPF 0-5 (A) NONE SEEN   WBC, UA 6-30 0 - 5 WBC/hpf   RBC / HPF TOO NUMEROUS TO COUNT 0 - 5 RBC/hpf   Bacteria, UA FEW (A) NONE SEEN  Pregnancy, urine POC     Status: None   Collection Time: 07/24/16 10:39 PM  Result Value Ref Range   Preg Test, Ur NEGATIVE NEGATIVE  Wet prep, genital     Status: Abnormal   Collection Time: 07/24/16 11:10 PM  Result Value Ref Range   Yeast Wet Prep HPF POC NONE SEEN NONE SEEN   Trich, Wet Prep NONE SEEN NONE SEEN   Clue Cells Wet Prep HPF POC NONE SEEN NONE SEEN   WBC, Wet Prep HPF POC FEW (A) NONE SEEN   Sperm NONE SEEN    Wet prep - negative  Assessment and Plan  UTI  Plan: Discharge home RX Macrobid Urine culture sent  Follow-up if no improvement or worsening of symptoms   Marlis EdelsonWalidah N Karim, CNM 07/24/2016 11:28 PM

## 2016-07-25 LAB — GC/CHLAMYDIA PROBE AMP (~~LOC~~) NOT AT ARMC
CHLAMYDIA, DNA PROBE: NEGATIVE
NEISSERIA GONORRHEA: NEGATIVE

## 2016-07-27 LAB — URINE CULTURE: Culture: 80000 — AB

## 2016-08-01 ENCOUNTER — Inpatient Hospital Stay (HOSPITAL_COMMUNITY)
Admission: AD | Admit: 2016-08-01 | Discharge: 2016-08-01 | Disposition: A | Discharge status: Home / Self Care | Payer: BLUE CROSS/BLUE SHIELD | Source: Ambulatory Visit | Attending: Family Medicine | Admitting: Family Medicine

## 2016-08-01 ENCOUNTER — Encounter (HOSPITAL_COMMUNITY): Payer: Self-pay | Admitting: *Deleted

## 2016-08-01 DIAGNOSIS — N3001 Acute cystitis with hematuria: Secondary | ICD-10-CM | POA: Insufficient documentation

## 2016-08-01 DIAGNOSIS — Z3202 Encounter for pregnancy test, result negative: Secondary | ICD-10-CM | POA: Diagnosis not present

## 2016-08-01 DIAGNOSIS — F17210 Nicotine dependence, cigarettes, uncomplicated: Secondary | ICD-10-CM | POA: Diagnosis not present

## 2016-08-01 DIAGNOSIS — O200 Threatened abortion: Secondary | ICD-10-CM

## 2016-08-01 DIAGNOSIS — R3915 Urgency of urination: Secondary | ICD-10-CM | POA: Diagnosis present

## 2016-08-01 LAB — URINALYSIS, ROUTINE W REFLEX MICROSCOPIC
Bilirubin Urine: NEGATIVE
GLUCOSE, UA: NEGATIVE mg/dL
Ketones, ur: 15 mg/dL — AB
Leukocytes, UA: NEGATIVE
Nitrite: NEGATIVE
PH: 6 (ref 5.0–8.0)
PROTEIN: 30 mg/dL — AB
SPECIFIC GRAVITY, URINE: 1.025 (ref 1.005–1.030)

## 2016-08-01 LAB — WET PREP, GENITAL
Clue Cells Wet Prep HPF POC: NONE SEEN
Sperm: NONE SEEN
TRICH WET PREP: NONE SEEN
YEAST WET PREP: NONE SEEN

## 2016-08-01 LAB — URINE MICROSCOPIC-ADD ON

## 2016-08-01 LAB — POCT PREGNANCY, URINE: Preg Test, Ur: NEGATIVE

## 2016-08-01 MED ORDER — SULFAMETHOXAZOLE-TRIMETHOPRIM 800-160 MG PO TABS: 1.0000 | ORAL_TABLET | Freq: Two times a day (BID) | ORAL | 0 refills | Status: AC

## 2016-08-01 MED ORDER — PHENAZOPYRIDINE HCL 200 MG PO TABS: 200.0000 mg | ORAL_TABLET | Freq: Three times a day (TID) | ORAL | 0 refills | Status: AC

## 2016-08-01 NOTE — MAU Provider Note (Signed)
History     CSN: [id number]  Arrival date and time: 08/01/16 4098   First Provider Initiated Contact with Patient 08/01/16 0840      Chief Complaint  Patient presents with  . Urinary Tract Infection  . Threatened Miscarriage   HPI   Ms.Brandi Ball is a 25 y.o. female G1P0010 here in MAU with continued UTI symptoms. She was seen 2 weeks ago and treated for a UTI, she has 3 days of antibiotics left however continues to have urinary frequency, urinary urgency.  She denies fever. She has continued to see blood in her urine; she is unsure whether it is coming from her urethra or her vagina.   OB History    Gravida Para Term Preterm AB Living   1 0 0 0 1 0   SAB TAB Ectopic Multiple Live Births   1 0 0 0        Past Medical History:  Diagnosis Date  . Asthma    childhood  . Bipolar 1 disorder (HCC)   . Depression     Past Surgical History:  Procedure Laterality Date  . ABDOMINAL SURGERY      History reviewed. No pertinent family history.  Social History  Substance Use Topics  . Smoking status: Current Every Day Smoker    Packs/day: 0.50    Types: Cigarettes  . Smokeless tobacco: Never Used  . Alcohol use Yes     Comment: wine    Allergies:  Allergies  Allergen Reactions  . Latex Itching and Rash    Prescriptions Prior to Admission  Medication Sig Dispense Refill Last Dose  . albuterol (PROVENTIL HFA;VENTOLIN HFA) 108 (90 Base) MCG/ACT inhaler Inhale 2 puffs into the lungs every 4 (four) hours as needed for wheezing or shortness of breath. 1 Inhaler 1 Past Week at Unknown time  . Multiple Vitamin (MULTIVITAMIN WITH MINERALS) TABS tablet Take 1 tablet by mouth daily.   Past Week at Unknown time  . nitrofurantoin, macrocrystal-monohydrate, (MACROBID) 100 MG capsule Take 1 capsule (100 mg total) by mouth 2 (two) times daily. 14 capsule 0 07/31/2016 at Unknown time   Results for orders placed or performed during the hospital encounter of 08/01/16 (from the past  48 hour(s))  Urinalysis, Routine w reflex microscopic (not at [facility])     Status: Abnormal   Collection Time: 08/01/16  7:44 AM  Result Value Ref Range   Color, Urine YELLOW YELLOW   APPearance CLEAR CLEAR   Specific Gravity, Urine 1.025 1.005 - 1.030   pH 6.0 5.0 - 8.0   Glucose, UA NEGATIVE NEGATIVE mg/dL   Hgb urine dipstick SMALL (A) NEGATIVE   Bilirubin Urine NEGATIVE NEGATIVE   Ketones, ur 15 (A) NEGATIVE mg/dL   Protein, ur 30 (A) NEGATIVE mg/dL   Nitrite NEGATIVE NEGATIVE   Leukocytes, UA NEGATIVE NEGATIVE  Urine microscopic-add on     Status: Abnormal   Collection Time: 08/01/16  7:44 AM  Result Value Ref Range   Squamous Epithelial / LPF 0-5 (A) NONE SEEN   WBC, UA 0-5 0 - 5 WBC/hpf   RBC / HPF 6-30 0 - 5 RBC/hpf   Bacteria, UA FEW (A) NONE SEEN   Urine-Other MUCOUS PRESENT   Pregnancy, urine POC     Status: None   Collection Time: 08/01/16  7:51 AM  Result Value Ref Range   Preg Test, Ur NEGATIVE NEGATIVE    Comment:        THE SENSITIVITY OF THIS METHODOLOGY IS >  24 mIU/mL    Review of Systems  Constitutional: Negative for fever.  Genitourinary: Positive for dysuria, frequency, hematuria and urgency. Negative for flank pain.  Musculoskeletal: Positive for back pain.   Physical Exam   Blood pressure 128/67, pulse 80, temperature 98.4 F (36.9 C), temperature source Oral, resp. rate 16, weight 200 lb (90.7 kg), last menstrual period 06/30/2016, SpO2 100 %.  Physical Exam  Constitutional: She appears well-developed and well-nourished. No distress.  GI: Normal appearance. There is tenderness in the suprapubic area. There is no rigidity and no guarding.  Genitourinary:  Genitourinary Comments: Vagina - Small amount of clear vaginal discharge, no odor  Cervix - No contact bleeding, no active bleeding  Bimanual exam: Cervix closed, no CMT  Uterus non tender, normal size Adnexa non tender, no masses bilaterally, + suprapubic tenderness  Wet prep done Chaperone  present for exam.   Skin: She is not diaphoretic.    MAU Course  Procedures  None  MDM  Reviewed urine culture from 10/17; Macrobid coverage sensitive  for bacteria found.   Assessment and Plan   A:  1. Acute cystitis with hematuria     P:  Discharge home in stable condition Rx: Pyridium, continue macrobid Follow up in the [facility] as needed, if symptoms do not improve once treatment is complete Increase PO fluid intake  Duane LopeJennifer I Aina Rossbach, NP 08/01/2016 9:38 AM

## 2016-08-01 NOTE — MAU Note (Signed)
Patient presents to MAU with c/o painful urination, frequency, and blood in her urine.  States that she was seen in MAU last week and was diagnosed with a UTI.  She has been taking her antibiotic Rx as prescribed, but has not had any relief and now wonders "if I'm having a miscarriage and that's why I'm bleeding?"  LMP was 06/30/16. Pregnancy test 10/15 in MAU was negative.

## 2016-08-01 NOTE — Discharge Instructions (Signed)

## 2016-09-16 ENCOUNTER — Emergency Department (HOSPITAL_COMMUNITY)
Admission: EM | Admit: 2016-09-16 | Discharge: 2016-09-16 | Disposition: A | Discharge status: Home / Self Care | Payer: BLUE CROSS/BLUE SHIELD | Attending: Emergency Medicine | Admitting: Emergency Medicine

## 2016-09-16 ENCOUNTER — Encounter (HOSPITAL_COMMUNITY): Payer: Self-pay

## 2016-09-16 DIAGNOSIS — Z91040 Latex allergy status: Secondary | ICD-10-CM | POA: Insufficient documentation

## 2016-09-16 DIAGNOSIS — M549 Dorsalgia, unspecified: Secondary | ICD-10-CM

## 2016-09-16 DIAGNOSIS — M545 Low back pain: Secondary | ICD-10-CM | POA: Diagnosis present

## 2016-09-16 DIAGNOSIS — F17210 Nicotine dependence, cigarettes, uncomplicated: Secondary | ICD-10-CM | POA: Insufficient documentation

## 2016-09-16 DIAGNOSIS — J45909 Unspecified asthma, uncomplicated: Secondary | ICD-10-CM | POA: Insufficient documentation

## 2016-09-16 DIAGNOSIS — Z79899 Other long term (current) drug therapy: Secondary | ICD-10-CM | POA: Diagnosis not present

## 2016-09-16 MED ORDER — CYCLOBENZAPRINE HCL 10 MG PO TABS: 10.0000 mg | ORAL_TABLET | Freq: Two times a day (BID) | ORAL | 0 refills | Status: AC | PRN

## 2016-09-16 NOTE — Discharge Instructions (Signed)
Please read attached information. If you experience any new or worsening signs or symptoms please return to the emergency room for evaluation. Please follow-up with your primary care provider or specialist as discussed. Please use medication prescribed only as directed and discontinue taking if you have any concerning signs or symptoms.   °

## 2016-09-16 NOTE — ED Triage Notes (Signed)
Pt. Coming from home via POV for left flank pain since yesterday. Pt. Denies any urinary symptoms. Pt. Does report recent hx of UTI.

## 2016-09-16 NOTE — ED Provider Notes (Signed)
[facility]PT Provider Note   CSN: [id number] Arrival date & time: 09/16/16  0808     History   Chief Complaint Chief Complaint  Patient presents with  . Flank Pain    HPI Brandi Ball is a 25 y.o. female.  HPI  [age] year old female presents today with complaints of back pain. Patient reports last night around 10 PM she started developing left upper back pain with progression down the lower back. She reports symptoms are made worse with palpation, movement, she denies any chest pain shortness of breath, cough. She denies any abdominal pain, dysuria. She denies any known injury. 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 She denies any other concerning signs or symptoms at this time. No distal neurological deficits, no red flags for back pain. 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 Asthma    childhood  . Bipolar 1 disorder (HCC)   . Depression     Patient Active Problem List   Diagnosis Date Noted  . Irregular menstrual cycle 04/27/2015  . Cough 06/06/2014  . URI (upper respiratory infection) 06/06/2014    Past Surgical History:  Procedure Laterality Date  . ABDOMINAL SURGERY      OB History    Gravida Para Term Preterm AB Living   1 0 0 0 1 0   SAB TAB Ectopic Multiple Live Births   1 0 0 0         Home Medications    Prior to Admission medications   Medication Sig Start Date End Date Taking? Authorizing Provider  albuterol (PROVENTIL HFA;VENTOLIN HFA) 108 (90 Base) MCG/ACT inhaler Inhale 2 puffs into the lungs every 4 (four) hours as needed for wheezing or shortness of breath. 06/23/16   Shawn C Joy, PA-C  cyclobenzaprine (FLEXERIL) 10 MG tablet Take 1 tablet (10 mg total) by mouth 2 (two) times daily as needed for muscle spasms. 09/16/16   Eyvonne MechanicJeffrey Ashara Lounsbury, PA-C  Multiple Vitamin (MULTIVITAMIN WITH MINERALS) TABS tablet Take 1 tablet by mouth daily.    Historical Provider, MD    phenazopyridine (PYRIDIUM) 200 MG tablet Take 1 tablet (200 mg total) by mouth 3 (three) times daily. 08/01/16   Duane LopeJennifer I Rasch, NP    Family History History reviewed. No pertinent family history.  Social History Social History  Substance Use Topics  . Smoking status: Current Every Day Smoker    Packs/day: 0.50    Types: Cigarettes  . Smokeless tobacco: Never Used  . Alcohol use Yes     Comment: wine     Allergies   Latex   Review of Systems Review of Systems  All other systems reviewed and are negative.   Physical Exam Updated Vital Signs BP 124/78 (BP Location: Right Arm)   Pulse 80   Temp 99 F (37.2 C) (Oral)   Resp 15   Ht 5\' 2"  (1.575 m)   Wt 92.1 kg   SpO2 100%   BMI 37.13 kg/m   Physical Exam  Constitutional: She is oriented to person, place, and time. She appears well-developed and well-nourished.  HENT:  Head: Normocephalic and atraumatic.  Eyes: Conjunctivae are normal. Pupils are equal, round, and reactive to light. Right eye exhibits no discharge. Left eye exhibits no discharge. No scleral icterus.  Neck: Normal range of motion. No JVD present. No tracheal deviation present.  Pulmonary/Chest: Effort normal. No stridor.  Abdominal: Soft. She exhibits no distension. There is  no tenderness.  Musculoskeletal:  TTP of left lateral thoracic and lumbar soft tissue- no rash deformities or signs of infection. No signs of scoliosis. Function was strength 5 out of 5, sensation intact, no extremity swelling or edema  Neurological: She is alert and oriented to person, place, and time. Coordination normal.  Psychiatric: She has a normal mood and affect. Her behavior is normal. Judgment and thought content normal.  Nursing note and vitals reviewed.    ED Treatments / Results  Labs (all labs ordered are listed, but only abnormal results are displayed) Labs Reviewed - No data to display  EKG  EKG Interpretation None       Radiology No results  found.  Procedures Procedures (including critical care time)  Medications Ordered in ED Medications - No data to display   Initial Impression / Assessment and Plan / ED Course  I have reviewed the triage vital signs and the nursing notes.  Pertinent labs & imaging results that were available during my care of the patient were reviewed by me and considered in my medical decision making (see chart for details).  Clinical Course      Final Clinical Impressions(s) / ED Diagnoses   Final diagnoses:  Left-sided back pain, unspecified back location, unspecified chronicity    Labs:  Imaging:  Consults:  Therapeutics:  Discharge Meds: Flexeril  Assessment/Plan:  25 year old female presents today with likely musculoskeletal back pain. She has no signs or symptoms that would indicate intrathoracic or abdominal pathology. Tenderness to palpation of the musculature, no rash or infectious etiology. Symptomatic care instructions given, return precautions given. She verbalized understanding and agreement to today's plan had no further questions or concerns at time of discharge      New Prescriptions New Prescriptions   CYCLOBENZAPRINE (FLEXERIL) 10 MG TABLET    Take 1 tablet (10 mg total) by mouth 2 (two) times daily as needed for muscle spasms.     Eyvonne MechanicJeffrey Olimpia Tinch, PA-C 09/16/16 [id number]    Laurence Spatesachel Morgan Little, MD 09/16/16 [phone]

## 2016-09-23 ENCOUNTER — Emergency Department (HOSPITAL_COMMUNITY)
Admission: EM | Admit: 2016-09-23 | Discharge: 2016-09-23 | Disposition: A | Discharge status: Home / Self Care | Payer: BLUE CROSS/BLUE SHIELD | Attending: Emergency Medicine | Admitting: Emergency Medicine

## 2016-09-23 ENCOUNTER — Encounter (HOSPITAL_COMMUNITY): Payer: Self-pay

## 2016-09-23 DIAGNOSIS — J45909 Unspecified asthma, uncomplicated: Secondary | ICD-10-CM | POA: Insufficient documentation

## 2016-09-23 DIAGNOSIS — Y9389 Activity, other specified: Secondary | ICD-10-CM | POA: Insufficient documentation

## 2016-09-23 DIAGNOSIS — Y92512 Supermarket, store or market as the place of occurrence of the external cause: Secondary | ICD-10-CM | POA: Insufficient documentation

## 2016-09-23 DIAGNOSIS — S39012A Strain of muscle, fascia and tendon of lower back, initial encounter: Secondary | ICD-10-CM | POA: Insufficient documentation

## 2016-09-23 DIAGNOSIS — Y990 Civilian activity done for income or pay: Secondary | ICD-10-CM | POA: Insufficient documentation

## 2016-09-23 DIAGNOSIS — Z91040 Latex allergy status: Secondary | ICD-10-CM | POA: Insufficient documentation

## 2016-09-23 DIAGNOSIS — F17210 Nicotine dependence, cigarettes, uncomplicated: Secondary | ICD-10-CM | POA: Diagnosis not present

## 2016-09-23 DIAGNOSIS — X500XXA Overexertion from strenuous movement or load, initial encounter: Secondary | ICD-10-CM | POA: Diagnosis not present

## 2016-09-23 DIAGNOSIS — T148XXA Other injury of unspecified body region, initial encounter: Secondary | ICD-10-CM

## 2016-09-23 DIAGNOSIS — S3992XA Unspecified injury of lower back, initial encounter: Secondary | ICD-10-CM | POA: Diagnosis present

## 2016-09-23 MED ORDER — METHOCARBAMOL 500 MG PO TABS: 500.0000 mg | ORAL_TABLET | Freq: Two times a day (BID) | ORAL | 0 refills | Status: AC

## 2016-09-23 MED ORDER — METHOCARBAMOL 500 MG PO TABS
1000.0000 mg | ORAL_TABLET | Freq: Once | ORAL | Status: AC
  Administered 2016-09-23: 1000 mg via ORAL
  Filled 2016-09-23: qty 2

## 2016-09-23 MED ORDER — LIDOCAINE 5 % EX PTCH: 1.0000 | MEDICATED_PATCH | CUTANEOUS | 0 refills | Status: AC

## 2016-09-23 MED ORDER — KETOROLAC TROMETHAMINE 60 MG/2ML IM SOLN
60.0000 mg | Freq: Once | INTRAMUSCULAR | Status: AC
  Administered 2016-09-23: 60 mg via INTRAMUSCULAR
  Filled 2016-09-23: qty 2

## 2016-09-23 NOTE — ED Notes (Signed)
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

## 2016-09-23 NOTE — Discharge Instructions (Signed)
Take it easy, but do not lay around too much as this may make the stiffness worse. Take 500 mg of naproxen every 12 hours or 800 mg of ibuprofen every 8 hours for the next 3 days. Take these medications with food to avoid upset stomach. Robaxin is a muscle relaxer and may help loosen stiff muscles. Do not take the Robaxin while driving or performing other dangerous activities. Be sure to perform the attached exercises starting with three times a week and working up to performing them daily. This is an essential part of preventing long term problems. Follow up with a primary care provider for any future management of these complaints. °

## 2016-09-23 NOTE — ED Triage Notes (Signed)
Lower back pain radiates into her mid back area.  Denies injury.  Pt. Was unable to work last night . 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 Denies any urinary symptoms.  Skin is warm and dry,.  Pt. Also reports having a stiff  Neck.

## 2016-09-23 NOTE — ED Provider Notes (Signed)
[facility] Provider Note   CSN: [id number] Arrival date & time: 09/23/16  [id number]   By signing my name below, I, Teofilo PodMatthew P. Jamison, attest that this documentation has been prepared under the direction and in the presence of Burman Bruington, PA-C. Electronically Signed: Teofilo PodMatthew P. Jamison, ED Scribe. 09/23/2016. 9:21 AM.   History   Chief Complaint Chief Complaint  Patient presents with  . Back Pain    The history is provided by the patient. No language interpreter was used.   HPI Comments:  Brandi Ball is a 25 y.o. female who presents to the Emergency Department complaining of waxing and waning lower back pain x 9 days. 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 Pain is described as a tightness and throbbing and radiates up towards her neck. Stiffness is worse upon waking in the morning. Pain increases with bending over and lifting heavy objects. Patient was seen for this complaint on December 8. She has intermittently tried ibuprofen and Flexeril with mild temporary relief. She denies additional trauma, neuro deficits, changes in bowel or bladder function, or any other complaints.   Past Medical History:  Diagnosis Date  . Asthma    childhood  . Bipolar 1 disorder (HCC)   . Depression     Patient Active Problem List   Diagnosis Date Noted  . Irregular menstrual cycle 04/27/2015  . Cough 06/06/2014  . URI (upper respiratory infection) 06/06/2014    Past Surgical History:  Procedure Laterality Date  . ABDOMINAL SURGERY      OB History    Gravida Para Term Preterm AB Living   1 0 0 0 1 0   SAB TAB Ectopic Multiple Live Births   1 0 0 0         Home Medications    Prior to Admission medications   Medication Sig Start Date End Date Taking? Authorizing Provider  albuterol (PROVENTIL HFA;VENTOLIN HFA) 108 (90 Base) MCG/ACT inhaler Inhale 2 puffs into the lungs every 4 (four) hours as needed for wheezing or shortness of breath.  06/23/16   Liem Copenhaver C Kielan Dreisbach, PA-C  cyclobenzaprine (FLEXERIL) 10 MG tablet Take 1 tablet (10 mg total) by mouth 2 (two) times daily as needed for muscle spasms. 09/16/16   Eyvonne MechanicJeffrey Hedges, PA-C  lidocaine (LIDODERM) 5 % Place 1 patch onto the skin daily. Remove & Discard patch within 12 hours or as directed by MD 09/23/16   Anselm PancoastShawn C Alahni Varone, PA-C  methocarbamol (ROBAXIN) 500 MG tablet Take 1 tablet (500 mg total) by mouth 2 (two) times daily. 09/23/16   Adalaya Irion C Jessieca Rhem, PA-C  Multiple Vitamin (MULTIVITAMIN WITH MINERALS) TABS tablet Take 1 tablet by mouth daily.    Historical Provider, MD  phenazopyridine (PYRIDIUM) 200 MG tablet Take 1 tablet (200 mg total) by mouth 3 (three) times daily. 08/01/16   Duane LopeJennifer I Rasch, NP    Family History No family history on file.  Social History Social History  Substance Use Topics  . Smoking status: Current Every Day Smoker    Packs/day: 0.50    Types: Cigarettes  . Smokeless tobacco: Never Used  . Alcohol use Yes     Comment: wine     Allergies   Latex   Review of Systems Review of Systems  Musculoskeletal: Positive for back pain.  Neurological: Negative for weakness and numbness.     Physical Exam Updated Vital Signs BP 113/73 (BP Location: Left Arm)   Pulse 85  Temp 98 F (36.7 C) (Oral)   Resp 20   Ht 5\' 3"  (1.6 m)   Wt 203 lb (92.1 kg)   LMP 09/21/2016   SpO2 99%   BMI 35.96 kg/m   Physical Exam  Constitutional: She is oriented to person, place, and time. She appears well-developed and well-nourished. No distress.  HENT:  Head: Normocephalic and atraumatic.  Eyes: Conjunctivae are normal.  Cardiovascular: Normal rate.   Pulmonary/Chest: Effort normal.  Musculoskeletal: Normal range of motion.  Tenderness from lower back up to the trapezius muscles bilaterally. Normal motor function intact in all extremities and spine. No midline spinal tenderness.   Neurological: She is alert and oriented to person, place, and time. She has normal  strength.  No sensory deficits. Strength 5/5 in all extremities. No gait disturbance. Coordination intact. Cranial nerves III-XII grossly intact.   Skin: Skin is warm and dry.  Psychiatric: She has a normal mood and affect.  Nursing note and vitals reviewed.    ED Treatments / Results  DIAGNOSTIC STUDIES:  Oxygen Saturation is 99% on RA, normal by my interpretation.    COORDINATION OF CARE:  9:21 AM Discussed treatment plan with pt at bedside and pt agreed to plan.   Labs (all labs ordered are listed, but only abnormal results are displayed) Labs Reviewed - No data to display  EKG  EKG Interpretation None       Radiology No results found.  Procedures Procedures (including critical care time)  Medications Ordered in ED Medications  ketorolac (TORADOL) injection 60 mg (60 mg Intramuscular Given 09/23/16 0945)  methocarbamol (ROBAXIN) tablet 1,000 mg (1,000 mg Oral Given 09/23/16 0945)     Initial Impression / Assessment and Plan / ED Course  I have reviewed the triage vital signs and the nursing notes.  Pertinent labs & imaging results that were available during my care of the patient were reviewed by me and considered in my medical decision making (see chart for details).  Clinical Course      Patient presents with back pain. Symptoms consistent with possible muscle strain. Further home care and return precautions discussed.    Final Clinical Impressions(s) / ED Diagnoses   Final diagnoses:  Muscle strain    New Prescriptions Discharge Medication List as of 09/23/2016  9:35 AM    START taking these medications   Details  lidocaine (LIDODERM) 5 % Place 1 patch onto the skin daily. Remove & Discard patch within 12 hours or as directed by MD, Starting Fri 09/23/2016, Print    methocarbamol (ROBAXIN) 500 MG tablet Take 1 tablet (500 mg total) by mouth 2 (two) times daily., Starting Fri 09/23/2016, Print       I personally performed the services  described in this documentation, which was scribed in my presence. The recorded information has been reviewed and is accurate.     Anselm PancoastShawn C Climmie Cronce, PA-C 09/23/16 1815    Vanetta MuldersScott Zackowski, MD 09/24/16 2320

## 2016-09-23 NOTE — Discharge Planning (Signed)
Pt up for discharge. EDCM reviewed chart for possible CM needs.  No needs identified or communicated.  

## 2017-03-06 ENCOUNTER — Encounter (HOSPITAL_COMMUNITY): Payer: Self-pay

## 2017-03-06 DIAGNOSIS — J45909 Unspecified asthma, uncomplicated: Secondary | ICD-10-CM | POA: Insufficient documentation

## 2017-03-06 DIAGNOSIS — F17210 Nicotine dependence, cigarettes, uncomplicated: Secondary | ICD-10-CM | POA: Insufficient documentation

## 2017-03-06 DIAGNOSIS — R1084 Generalized abdominal pain: Secondary | ICD-10-CM | POA: Insufficient documentation

## 2017-03-06 DIAGNOSIS — Z91040 Latex allergy status: Secondary | ICD-10-CM | POA: Insufficient documentation

## 2017-03-06 DIAGNOSIS — Z79899 Other long term (current) drug therapy: Secondary | ICD-10-CM | POA: Insufficient documentation

## 2017-03-06 LAB — COMPREHENSIVE METABOLIC PANEL
ALT: 14 U/L (ref 14–54)
ANION GAP: 8 (ref 5–15)
AST: 22 U/L (ref 15–41)
Albumin: 3.8 g/dL (ref 3.5–5.0)
Alkaline Phosphatase: 50 U/L (ref 38–126)
BUN: 9 mg/dL (ref 6–20)
CALCIUM: 9.1 mg/dL (ref 8.9–10.3)
CO2: 26 mmol/L (ref 22–32)
Chloride: 104 mmol/L (ref 101–111)
Creatinine, Ser: 0.98 mg/dL (ref 0.44–1.00)
GFR calc non Af Amer: >60 mL/min (ref >60)
Glucose, Bld: 106 mg/dL — ABNORMAL HIGH (ref 65–99)
POTASSIUM: 3.9 mmol/L (ref 3.5–5.1)
Sodium: 138 mmol/L (ref 135–145)
TOTAL PROTEIN: 6.5 g/dL (ref 6.5–8.1)
Total Bilirubin: 0.4 mg/dL (ref 0.3–1.2)

## 2017-03-06 LAB — LIPASE, BLOOD: Lipase: 14 U/L (ref 11–51)

## 2017-03-06 LAB — POC URINE PREG, ED: PREG TEST UR: NEGATIVE

## 2017-03-06 LAB — CBC
HEMATOCRIT: 39.9 % (ref 36.0–46.0)
HEMOGLOBIN: 13.3 g/dL (ref 12.0–15.0)
MCH: 29 pg (ref 26.0–34.0)
MCHC: 33.3 g/dL (ref 30.0–36.0)
MCV: 86.9 fL (ref 78.0–100.0)
Platelets: 252 10*3/uL (ref 150–400)
RBC: 4.59 MIL/uL (ref 3.87–5.11)
RDW: 13.9 % (ref 11.5–15.5)
WBC: 8.6 10*3/uL (ref 4.0–10.5)

## 2017-03-06 NOTE — ED Triage Notes (Signed)
Pt states she had sudden onset of upper abdominal pain with vomiting today; pt states last BM was on this past Friday and was normal; Pt states pain 8/10 on arrival; Pt a&ox 4;

## 2017-03-07 ENCOUNTER — Emergency Department (HOSPITAL_COMMUNITY)
Admission: EM | Admit: 2017-03-07 | Discharge: 2017-03-07 | Disposition: A | Discharge status: Home / Self Care | Payer: Self-pay | Attending: Emergency Medicine | Admitting: Emergency Medicine

## 2017-03-07 ENCOUNTER — Emergency Department (HOSPITAL_COMMUNITY): Payer: Self-pay

## 2017-03-07 DIAGNOSIS — R1010 Upper abdominal pain, unspecified: Secondary | ICD-10-CM

## 2017-03-07 LAB — URINALYSIS, ROUTINE W REFLEX MICROSCOPIC
Bacteria, UA: NONE SEEN
Bilirubin Urine: NEGATIVE
GLUCOSE, UA: NEGATIVE mg/dL
Hgb urine dipstick: NEGATIVE
KETONES UR: 20 mg/dL — AB
Leukocytes, UA: NEGATIVE
Nitrite: NEGATIVE
PROTEIN: 30 mg/dL — AB
Specific Gravity, Urine: 1.029 (ref 1.005–1.030)
pH: 6 (ref 5.0–8.0)

## 2017-03-07 MED ORDER — ONDANSETRON HCL 4 MG/2ML IJ SOLN
4.0000 mg | Freq: Once | INTRAMUSCULAR | Status: AC
  Administered 2017-03-07: 4 mg via INTRAVENOUS
  Filled 2017-03-07: qty 2

## 2017-03-07 MED ORDER — GI COCKTAIL ~~LOC~~
30.0000 mL | Freq: Once | ORAL | Status: AC
  Administered 2017-03-07: 30 mL via ORAL
  Filled 2017-03-07: qty 30

## 2017-03-07 MED ORDER — MORPHINE SULFATE (PF) 4 MG/ML IV SOLN
4.0000 mg | Freq: Once | INTRAVENOUS | Status: AC
Start: 2017-03-07 — End: 2017-03-07
  Administered 2017-03-07: 4 mg via INTRAVENOUS
  Filled 2017-03-07: qty 1

## 2017-03-07 NOTE — ED Provider Notes (Signed)
[facility] Provider Note   CSN: [id number] Arrival date & time: 03/06/17  2214   By signing my name below, I, Clarisse Gouge, attest that this documentation has been prepared under the direction and in the presence of Dione Booze, MD. Electronically signed, Clarisse Gouge, ED Scribe. 03/07/17. 12:27 AM.   History   Chief Complaint Chief Complaint  Patient presents with  . Abdominal Pain   The history is provided by the patient and medical records. No language interpreter was used.    Brandi Ball is a 26 y.o. female who presents to the Emergency Department with concern for new onset central upper abdominal pain x ~12 yesterday. She states she ate peanut butter an pretzels prior to the onset of her pain. Associated constipation x 4-5 days, nausea and vomiting x 2 noted. Pt describes 8/10, sharp, aching abdominal pain that occasionally radiates bilaterally and was mildly relieved after her first vomiting spell yesterday. She has attempted to drink castor oil and water without relief to pain or constipation. No other modifying factors noted. Pt states she smokes ~1/2 pack of cigarettes daily and drinks alcohol occasionally, though she did not drink alcohol this evening. No concern for pregnancy noted. No other complaints at this time. No PCP noted.  Past Medical History:  Diagnosis Date  . Asthma    childhood  . Bipolar 1 disorder (HCC)   . Depression     Patient Active Problem List   Diagnosis Date Noted  . Irregular menstrual cycle 04/27/2015  . Cough 06/06/2014  . URI (upper respiratory infection) 06/06/2014    Past Surgical History:  Procedure Laterality Date  . ABDOMINAL SURGERY      OB History    Gravida Para Term Preterm AB Living   1 0 0 0 1 0   SAB TAB Ectopic Multiple Live Births   1 0 0 0         Home Medications    Prior to Admission medications   Medication Sig Start Date End Date Taking? Authorizing Provider  albuterol (PROVENTIL HFA;VENTOLIN  HFA) 108 (90 Base) MCG/ACT inhaler Inhale 2 puffs into the lungs every 4 (four) hours as needed for wheezing or shortness of breath. 06/23/16   Joy, Shawn C, PA-C  cyclobenzaprine (FLEXERIL) 10 MG tablet Take 1 tablet (10 mg total) by mouth 2 (two) times daily as needed for muscle spasms. 09/16/16   Hedges, Tinnie Gens, PA-C  lidocaine (LIDODERM) 5 % Place 1 patch onto the skin daily. Remove & Discard patch within 12 hours or as directed by MD 09/23/16   Joy, Shawn C, PA-C  methocarbamol (ROBAXIN) 500 MG tablet Take 1 tablet (500 mg total) by mouth 2 (two) times daily. 09/23/16   Joy, Shawn C, PA-C  Multiple Vitamin (MULTIVITAMIN WITH MINERALS) TABS tablet Take 1 tablet by mouth daily.    [provider]  phenazopyridine (PYRIDIUM) 200 MG tablet Take 1 tablet (200 mg total) by mouth 3 (three) times daily. 08/01/16   Rasch, Harolyn Rutherford, NP    Family History No family history on file.  Social History Social History  Substance Use Topics  . Smoking status: Current Every Day Smoker    Packs/day: 0.50    Types: Cigarettes  . Smokeless tobacco: Never Used  . Alcohol use Yes     Comment: wine     Allergies   Latex   Review of Systems Review of Systems  Gastrointestinal: Positive for abdominal pain, constipation, nausea and vomiting.  All other systems  reviewed and are negative.    Physical Exam Updated Vital Signs BP 120/81 (BP Location: Left Arm)   Pulse 66   Temp 98.7 F (37.1 C) (Oral)   Resp 20   LMP 02/27/2017 (Approximate)   SpO2 100%   Physical Exam  Constitutional: She is oriented to person, place, and time. She appears well-developed and well-nourished.  HENT:  Head: Normocephalic and atraumatic.  Eyes: EOM are normal. Pupils are equal, round, and reactive to light.  Neck: Normal range of motion. Neck supple. No JVD present.  Cardiovascular: Normal rate, regular rhythm and normal heart sounds.   No murmur heard. Pulmonary/Chest: Effort normal and breath sounds  normal. She has no wheezes. She has no rales. She exhibits no tenderness.  Abdominal: Soft. She exhibits no distension and no mass. Bowel sounds are decreased. There is tenderness. There is no rebound and no guarding.  Moderate tenderness across the subcostal area  Musculoskeletal: Normal range of motion. She exhibits no edema.  Lymphadenopathy:    She has no cervical adenopathy.  Neurological: She is alert and oriented to person, place, and time. No cranial nerve deficit. She exhibits normal muscle tone. Coordination normal.  Skin: Skin is warm and dry. No rash noted.  Psychiatric: She has a normal mood and affect. Her behavior is normal. Judgment and thought content normal.  Nursing note and vitals reviewed.    ED Treatments / Results  DIAGNOSTIC STUDIES: Oxygen Saturation is 100% on RA, NL by my interpretation.    COORDINATION OF CARE: 12:22 AM-Discussed next steps with pt. Pt verbalized understanding and is agreeable with the plan. Will order imaging and medications.   Labs (all labs ordered are listed, but only abnormal results are displayed) Labs Reviewed  COMPREHENSIVE METABOLIC PANEL - Abnormal; Notable for the following:       Result Value   Glucose, Bld 106 (*)    All other components within normal limits  URINALYSIS, ROUTINE W REFLEX MICROSCOPIC - Abnormal; Notable for the following:    APPearance HAZY (*)    Ketones, ur 20 (*)    Protein, ur 30 (*)    Squamous Epithelial / LPF 0-5 (*)    All other components within normal limits  LIPASE, BLOOD  CBC  POC URINE PREG, ED    Radiology US Abdomen Complete  Result Date: 03/07/2017 CLINICAL DATA:  Acute onset of generalized abdominal pain. Initial encounter. EXAM: ABDOMEN ULTRASOUND COMPLETE COMPARISON:  None. FINDINGS: Gallbladder: No gallstones or wall thickening visualized. No sonographic Murphy sign noted by sonographer. Common bile duct: Diameter: 0.2 cm, within normal limits in caliber. Liver: No focal lesion  identified. Within normal limits in parenchymal echogenicity. IVC: No abnormality visualized. Pancreas: Visualized portion unremarkable. Spleen: Size and appearance within normal limits. Right Kidney: Length: 9.2 cm. Echogenicity within normal limits. No mass or hydronephrosis visualized. Left Kidney: Length: 10.1 cm. Echogenicity within normal limits. No mass or hydronephrosis visualized. Abdominal aorta: No aneurysm visualized. Other findings: None. IMPRESSION: Unremarkable abdominal ultrasound. Electronically Signed   By: Roanna Raider M.D.   On: 03/07/2017 01:48    Procedures Procedures (including critical care time)  Medications Ordered in ED Medications  ondansetron (ZOFRAN) injection 4 mg (4 mg Intravenous Given 03/07/17 0154)  morphine 4 MG/ML injection 4 mg (4 mg Intravenous Given 03/07/17 0153)  gi cocktail (Maalox,Lidocaine,Donnatal) (30 mLs Oral Given 03/07/17 0310)     Initial Impression / Assessment and Plan / ED Course  I have reviewed the triage vital signs and the  nursing notes.  Pertinent labs & imaging results that were available during my care of the patient were reviewed by me and considered in my medical decision making (see chart for details).  Upper abdominal pain of uncertain cause. Some aspects suggest biliary colic, so she is sent for abdominal ultrasound which shows no evidence of cholelithiasis. Laboratory workup was unremarkable. She was given a GI cocktail with partial relief of symptoms. This point, I feel it is likely a GI-possibly GERD or peptic ulcer disease. She is discharged with instructions to take over-the-counter omeprazole. Return precautions discussed. Old records were reviewed, and she has no relevant past visits.  Final Clinical Impressions(s) / ED Diagnoses   Final diagnoses:  Upper abdominal pain    New Prescriptions New Prescriptions   No medications on file   I personally performed the services described in this documentation, which was  scribed in my presence. The recorded information has been reviewed and is accurate.       Dione BoozeGlick, Kern Gingras, MD 03/07/17 [phone]

## 2017-03-07 NOTE — Discharge Instructions (Signed)
Take omeprazole (Prilosec OTC) once a day. Return if symptoms are getting worse. °

## 2017-03-07 NOTE — ED Notes (Signed)
Patient transported to Ultrasound 

## 2017-03-08 ENCOUNTER — Encounter (HOSPITAL_COMMUNITY): Payer: Self-pay

## 2017-03-08 ENCOUNTER — Emergency Department (HOSPITAL_COMMUNITY)
Admission: EM | Admit: 2017-03-08 | Discharge: 2017-03-08 | Disposition: A | Discharge status: Home / Self Care | Payer: BLUE CROSS/BLUE SHIELD | Attending: Dermatology | Admitting: Dermatology

## 2017-03-08 DIAGNOSIS — R112 Nausea with vomiting, unspecified: Secondary | ICD-10-CM | POA: Insufficient documentation

## 2017-03-08 DIAGNOSIS — Z5321 Procedure and treatment not carried out due to patient leaving prior to being seen by health care provider: Secondary | ICD-10-CM | POA: Insufficient documentation

## 2017-03-08 DIAGNOSIS — R1084 Generalized abdominal pain: Secondary | ICD-10-CM | POA: Insufficient documentation

## 2017-03-08 LAB — COMPREHENSIVE METABOLIC PANEL
ALT: 12 U/L — ABNORMAL LOW (ref 14–54)
ANION GAP: 9 (ref 5–15)
AST: 19 U/L (ref 15–41)
Albumin: 3.4 g/dL — ABNORMAL LOW (ref 3.5–5.0)
Alkaline Phosphatase: 51 U/L (ref 38–126)
BUN: 6 mg/dL (ref 6–20)
CHLORIDE: 104 mmol/L (ref 101–111)
CO2: 24 mmol/L (ref 22–32)
Calcium: 8.7 mg/dL — ABNORMAL LOW (ref 8.9–10.3)
Creatinine, Ser: 1.01 mg/dL — ABNORMAL HIGH (ref 0.44–1.00)
Glucose, Bld: 92 mg/dL (ref 65–99)
Potassium: 3.8 mmol/L (ref 3.5–5.1)
Sodium: 137 mmol/L (ref 135–145)
Total Bilirubin: 0.6 mg/dL (ref 0.3–1.2)
Total Protein: 5.9 g/dL — ABNORMAL LOW (ref 6.5–8.1)

## 2017-03-08 LAB — CBC
HEMATOCRIT: 37.3 % (ref 36.0–46.0)
HEMOGLOBIN: 12.4 g/dL (ref 12.0–15.0)
MCH: 28.7 pg (ref 26.0–34.0)
MCHC: 33.2 g/dL (ref 30.0–36.0)
MCV: 86.3 fL (ref 78.0–100.0)
PLATELETS: 271 10*3/uL (ref 150–400)
RBC: 4.32 MIL/uL (ref 3.87–5.11)
RDW: 13.7 % (ref 11.5–15.5)
WBC: 6.8 10*3/uL (ref 4.0–10.5)

## 2017-03-08 LAB — LIPASE, BLOOD: LIPASE: 18 U/L (ref 11–51)

## 2017-03-08 NOTE — ED Triage Notes (Signed)
Patient reports that she is having ongoing pain in general abdomen with nausea/vomiting. Seen on Monday night had labs, urine and U/S done that she reports niormal

## 2017-03-08 NOTE — ED Notes (Signed)
Called pt name x3 to be roomed. No response. 

## 2017-07-09 ENCOUNTER — Emergency Department (HOSPITAL_COMMUNITY)
Admission: EM | Admit: 2017-07-09 | Discharge: 2017-07-09 | Disposition: A | Discharge status: Home / Self Care | Payer: BLUE CROSS/BLUE SHIELD | Attending: Emergency Medicine | Admitting: Emergency Medicine

## 2017-07-09 ENCOUNTER — Encounter (HOSPITAL_COMMUNITY): Payer: Self-pay | Admitting: Emergency Medicine

## 2017-07-09 DIAGNOSIS — F17210 Nicotine dependence, cigarettes, uncomplicated: Secondary | ICD-10-CM | POA: Insufficient documentation

## 2017-07-09 DIAGNOSIS — Z79899 Other long term (current) drug therapy: Secondary | ICD-10-CM | POA: Insufficient documentation

## 2017-07-09 DIAGNOSIS — Z91040 Latex allergy status: Secondary | ICD-10-CM | POA: Insufficient documentation

## 2017-07-09 DIAGNOSIS — G43909 Migraine, unspecified, not intractable, without status migrainosus: Secondary | ICD-10-CM | POA: Insufficient documentation

## 2017-07-09 DIAGNOSIS — J00 Acute nasopharyngitis [common cold]: Secondary | ICD-10-CM | POA: Insufficient documentation

## 2017-07-09 DIAGNOSIS — J45909 Unspecified asthma, uncomplicated: Secondary | ICD-10-CM | POA: Insufficient documentation

## 2017-07-09 LAB — CBC
HCT: 35.1 % — ABNORMAL LOW (ref 36.0–46.0)
Hemoglobin: 11.7 g/dL — ABNORMAL LOW (ref 12.0–15.0)
MCH: 28.8 pg (ref 26.0–34.0)
MCHC: 33.3 g/dL (ref 30.0–36.0)
MCV: 86.5 fL (ref 78.0–100.0)
PLATELETS: 259 10*3/uL (ref 150–400)
RBC: 4.06 MIL/uL (ref 3.87–5.11)
RDW: 14 % (ref 11.5–15.5)
WBC: 5.6 10*3/uL (ref 4.0–10.5)

## 2017-07-09 LAB — URINALYSIS, ROUTINE W REFLEX MICROSCOPIC
Bacteria, UA: NONE SEEN
Bilirubin Urine: NEGATIVE
GLUCOSE, UA: NEGATIVE mg/dL
Hgb urine dipstick: NEGATIVE
KETONES UR: NEGATIVE mg/dL
Nitrite: NEGATIVE
PH: 6 (ref 5.0–8.0)
Protein, ur: NEGATIVE mg/dL
Specific Gravity, Urine: 1.026 (ref 1.005–1.030)

## 2017-07-09 LAB — BASIC METABOLIC PANEL
Anion gap: 5 (ref 5–15)
BUN: 10 mg/dL (ref 6–20)
CALCIUM: 8.8 mg/dL — AB (ref 8.9–10.3)
CHLORIDE: 107 mmol/L (ref 101–111)
CO2: 24 mmol/L (ref 22–32)
CREATININE: 0.97 mg/dL (ref 0.44–1.00)
GFR calc non Af Amer: >60 mL/min (ref >60)
Glucose, Bld: 103 mg/dL — ABNORMAL HIGH (ref 65–99)
Potassium: 3.6 mmol/L (ref 3.5–5.1)
SODIUM: 136 mmol/L (ref 135–145)

## 2017-07-09 LAB — PREGNANCY, URINE: Preg Test, Ur: NEGATIVE

## 2017-07-09 MED ORDER — DEXAMETHASONE 4 MG PO TABS
10.0000 mg | ORAL_TABLET | Freq: Once | ORAL | Status: AC
  Administered 2017-07-09: 10 mg via ORAL
  Filled 2017-07-09: qty 3

## 2017-07-09 MED ORDER — DIPHENHYDRAMINE HCL 25 MG PO CAPS
25.0000 mg | ORAL_CAPSULE | Freq: Once | ORAL | Status: AC
  Administered 2017-07-09: 25 mg via ORAL
  Filled 2017-07-09: qty 1

## 2017-07-09 MED ORDER — ALBUTEROL SULFATE HFA 108 (90 BASE) MCG/ACT IN AERS
2.0000 | INHALATION_SPRAY | Freq: Once | RESPIRATORY_TRACT | Status: AC
  Administered 2017-07-09: 2 via RESPIRATORY_TRACT
  Filled 2017-07-09: qty 6.7

## 2017-07-09 MED ORDER — METOCLOPRAMIDE HCL 10 MG PO TABS
10.0000 mg | ORAL_TABLET | Freq: Once | ORAL | Status: AC
  Administered 2017-07-09: 10 mg via ORAL
  Filled 2017-07-09: qty 1

## 2017-07-09 NOTE — ED Notes (Signed)
Pt and friend provided with beverage and [place] sandwich.

## 2017-07-09 NOTE — ED Notes (Signed)
Patient able to ambulate independently  

## 2017-07-09 NOTE — Discharge Instructions (Signed)
You may take over-the-counter medicine for symptomatic relief, such as Tylenol, Motrin, TheraFlu, Alka seltzer , black elderberry, etc. Please limit acetaminophen (Tylenol) to 4000 mg and Ibuprofen (Motrin, Advil, etc.) to 2400 mg for a 24hr period. Please note that other over-the-counter medicine may contain acetaminophen or ibuprofen as a component of their ingredients.   

## 2017-07-09 NOTE — ED Triage Notes (Signed)
Pt reports dizziness and cough x1 weeks, states she works at night and gets dizzy a lot when she is there. Denies any syncopal episodes, pt a/ox4, resp e/u, nad. No neuro deficits noted.

## 2017-07-09 NOTE — ED Provider Notes (Signed)
[facility]-EMERGENCY DEPT Provider Note   CSN: [id number] Arrival date & time: 07/09/17  1234     History   Chief Complaint Chief Complaint  Patient presents with  . Cough  . Dizziness    HPI Brandi Ball is a 26 y.o. female.  The history is provided by the patient.  URI   This is a new problem. Episode onset: 1 week. The problem has not changed since onset.The maximum temperature recorded prior to her arrival was 102 to 102.9 F (last night). Associated symptoms include congestion, rhinorrhea and cough. She has tried an inhaler for the symptoms.   Typically is worse in the evening while lying down. States that she feels the draining in the back of her throat, which causes her to feel sob.  Gets lightheaded with coughing.  Past Medical History:  Diagnosis Date  . Asthma    childhood  . Bipolar 1 disorder (HCC)   . Depression     Patient Active Problem List   Diagnosis Date Noted  . Irregular menstrual cycle 04/27/2015  . Cough 06/06/2014  . URI (upper respiratory infection) 06/06/2014    Past Surgical History:  Procedure Laterality Date  . ABDOMINAL SURGERY      OB History    Gravida Para Term Preterm AB Living   1 0 0 0 1 0   SAB TAB Ectopic Multiple Live Births   1 0 0 0         Home Medications    Prior to Admission medications   Medication Sig Start Date End Date Taking? Authorizing Provider  albuterol (PROVENTIL HFA;VENTOLIN HFA) 108 (90 Base) MCG/ACT inhaler Inhale 2 puffs into the lungs every 4 (four) hours as needed for wheezing or shortness of breath. 06/23/16  Yes Joy, Shawn C, PA-C  aspirin-acetaminophen-caffeine (EXCEDRIN MIGRAINE) 6307600604 MG tablet Take 2 tablets by mouth every 6 (six) hours as needed for headache.   Yes [provider]  cyclobenzaprine (FLEXERIL) 10 MG tablet Take 1 tablet (10 mg total) by mouth 2 (two) times Ball as needed for muscle spasms. 09/16/16  Yes Hedges, Tinnie Gens, PA-C  lidocaine (LIDODERM) 5 % Place 1 patch  onto the skin Ball. Remove & Discard patch within 12 hours or as directed by MD Patient taking differently: Place 1 patch onto the skin as needed. Remove & Discard patch within 12 hours or as directed by MD 09/23/16  Yes Joy, Shawn C, PA-C  methocarbamol (ROBAXIN) 500 MG tablet Take 1 tablet (500 mg total) by mouth 2 (two) times Ball. 09/23/16  Yes Joy, Shawn C, PA-C  phenazopyridine (PYRIDIUM) 200 MG tablet Take 1 tablet (200 mg total) by mouth 3 (three) times Ball. Patient not taking: Reported on 07/09/2017 08/01/16   Rasch, Harolyn Rutherford, NP    Family History No family history on file.  Social History Social History  Substance Use Topics  . Smoking status: Current Every Day Smoker    Packs/day: 0.50    Types: Cigarettes  . Smokeless tobacco: Never Used  . Alcohol use Yes     Comment: wine     Allergies   Penicillins and Latex   Review of Systems Review of Systems  HENT: Positive for congestion and rhinorrhea.   Respiratory: Positive for cough.   All other systems are reviewed and are negative for acute change except as noted in the HPI    Physical Exam Updated Vital Signs BP 117/65   Pulse 81   Temp 98.5 F (36.9 C) (Oral)  Resp (!) 22   LMP 06/13/2017 (Approximate)   SpO2 100%   Physical Exam  Constitutional: She is oriented to person, place, and time. She appears well-developed and well-nourished. No distress.  HENT:  Head: Normocephalic and atraumatic.  Nose: Mucosal edema and rhinorrhea present.  Mouth/Throat: Mucous membranes are normal. No tonsillar exudate.  Post nasal drip  Eyes: Pupils are equal, round, and reactive to light. Conjunctivae and EOM are normal. Right eye exhibits no discharge. Left eye exhibits no discharge. No scleral icterus.  Neck: Normal range of motion. Neck supple.  Cardiovascular: Normal rate and regular rhythm.  Exam reveals no gallop and no friction rub.   No murmur heard. Pulmonary/Chest: Effort normal and breath sounds  normal. No stridor. No respiratory distress. She has no rales.  Abdominal: Soft. She exhibits no distension. There is no tenderness.  Musculoskeletal: She exhibits no edema or tenderness.  Neurological: She is alert and oriented to person, place, and time.  Mental Status: Alert and oriented to person, place, and time. Attention and concentration normal. Speech clear. Recent memory is intact  Cranial Nerves  II Visual Fields: Intact to confrontation. Visual fields intact. III, IV, VI: Pupils equal and reactive to light and near. Full eye movement without nystagmus  V Facial Sensation: Normal. No weakness of masticatory muscles  VII: No facial weakness or asymmetry  VIII Auditory Acuity: Grossly normal  IX/X: The uvula is midline; the palate elevates symmetrically  XI: Normal sternocleidomastoid and trapezius strength  XII: The tongue is midline. No atrophy or fasciculations.   Motor System: Muscle Strength: 5/5 and symmetric in the upper and lower extremities. No pronation or drift.  Muscle Tone: Tone and muscle bulk are normal in the upper and lower extremities.   Reflexes: DTRs: 1+ and symmetrical in all four extremities. Plantar responses are flexor bilaterally.  Coordination: Intact finger-to-nose No tremor.  Sensation: Intact to light touch, and pinprick.  Gait: Routine gait normal.    Skin: Skin is warm and dry. No rash noted. She is not diaphoretic. No erythema.  Psychiatric: She has a normal mood and affect.  Vitals reviewed.    ED Treatments / Results  Labs (all labs ordered are listed, but only abnormal results are displayed) Labs Reviewed  BASIC METABOLIC PANEL - Abnormal; Notable for the following:       Result Value   Glucose, Bld 103 (*)    Calcium 8.8 (*)    All other components within normal limits  CBC - Abnormal; Notable for the following:    Hemoglobin 11.7 (*)    HCT 35.1 (*)    All other components within normal limits  URINALYSIS, ROUTINE W REFLEX  MICROSCOPIC - Abnormal; Notable for the following:    APPearance HAZY (*)    Leukocytes, UA TRACE (*)    Squamous Epithelial / LPF 6-30 (*)    All other components within normal limits  PREGNANCY, URINE    EKG  EKG Interpretation  Date/Time:  Sunday July 09 2017 12:45:59 EDT Ventricular Rate:  79 PR Interval:  138 QRS Duration: 80 QT Interval:  340 QTC Calculation: 389 R Axis:   84 Text Interpretation:  Normal sinus rhythm with sinus arrhythmia Normal ECG No significant change since last tracing Confirmed by Drema Pry [phone]) on 07/09/2017 4:07:33 PM       Radiology No results found.  Procedures Procedures (including critical care time)  Medications Ordered in ED Medications  metoCLOPramide (REGLAN) tablet 10 mg (10 mg Oral Given 07/09/17  1633)  dexamethasone (DECADRON) tablet 10 mg (10 mg Oral Given 07/09/17 1633)  diphenhydrAMINE (BENADRYL) capsule 25 mg (25 mg Oral Given 07/09/17 1632)  albuterol (PROVENTIL HFA;VENTOLIN HFA) 108 (90 Base) MCG/ACT inhaler 2 puff (2 puffs Inhalation Given 07/09/17 1632)     Initial Impression / Assessment and Plan / ED Course  I have reviewed the triage vital signs and the nursing notes.  Pertinent labs & imaging results that were available during my care of the patient were reviewed by me and considered in my medical decision making (see chart for details).     26 y.o. female presents with cough, rhinorrhea for 6-7 days with 1 day of fever. adequate oral hydration. Rest of history as above.  Patient appears well. No signs of toxicity, patient is interactive and playful. No hypoxia, tachypnea or other signs of respiratory distress. No sign of clinical dehydration. Lung exam clear. Rest of exam as above.  Most consistent with viral upper respiratory infection.   No evidence suggestive of pharyngitis, AOM, PNA, or meningitis.   Chest x-ray not indicated at this time.   Patient also reported typical migraine headache for the  pt. Non focal neuro exam. No recent head trauma. No nuchal rigidty, doubt meningitis. Doubt intracranial bleed. Doubt IIH. No indication for imaging. Will treat with migraine cocktail.  On reassessment patient had complete resolution of her migraine headache.  Discussed symptomatic treatment with the patient and they will follow closely with their PCP.      Final Clinical Impressions(s) / ED Diagnoses   Final diagnoses:  Acute nasopharyngitis  Migraine syndrome   Disposition: Discharge  Condition: Good  I have discussed the results, Dx and Tx plan with the patient who expressed understanding and agree(s) with the plan. Discharge instructions discussed at great length. The patient was given strict return precautions who verbalized understanding of the instructions. No further questions at time of discharge.    New Prescriptions   No medications on file    Follow Up: Primary care provider  Schedule an appointment as soon as possible for a visit  in 5-7 days, If symptoms do not improve or  worsen      Lennan Malone, Amadeo Garnet, MD 07/09/17 1722

## 2017-07-09 NOTE — ED Notes (Signed)
MD aware of patient's response to medication

## 2017-08-09 ENCOUNTER — Encounter (HOSPITAL_COMMUNITY): Payer: Self-pay | Admitting: *Deleted

## 2017-08-09 ENCOUNTER — Inpatient Hospital Stay (HOSPITAL_COMMUNITY)
Admission: AD | Admit: 2017-08-09 | Discharge: 2017-08-09 | Disposition: A | Discharge status: Home / Self Care | Payer: BLUE CROSS/BLUE SHIELD | Source: Ambulatory Visit | Attending: Obstetrics and Gynecology | Admitting: Obstetrics and Gynecology

## 2017-08-09 DIAGNOSIS — L293 Anogenital pruritus, unspecified: Secondary | ICD-10-CM

## 2017-08-09 DIAGNOSIS — Z91040 Latex allergy status: Secondary | ICD-10-CM | POA: Insufficient documentation

## 2017-08-09 DIAGNOSIS — B373 Candidiasis of vulva and vagina: Secondary | ICD-10-CM

## 2017-08-09 DIAGNOSIS — B9689 Other specified bacterial agents as the cause of diseases classified elsewhere: Secondary | ICD-10-CM | POA: Insufficient documentation

## 2017-08-09 DIAGNOSIS — B3731 Acute candidiasis of vulva and vagina: Secondary | ICD-10-CM

## 2017-08-09 DIAGNOSIS — Z9889 Other specified postprocedural states: Secondary | ICD-10-CM | POA: Insufficient documentation

## 2017-08-09 DIAGNOSIS — Z3202 Encounter for pregnancy test, result negative: Secondary | ICD-10-CM | POA: Insufficient documentation

## 2017-08-09 DIAGNOSIS — F17210 Nicotine dependence, cigarettes, uncomplicated: Secondary | ICD-10-CM | POA: Insufficient documentation

## 2017-08-09 DIAGNOSIS — J45909 Unspecified asthma, uncomplicated: Secondary | ICD-10-CM | POA: Insufficient documentation

## 2017-08-09 DIAGNOSIS — Z880 Allergy status to penicillin: Secondary | ICD-10-CM | POA: Insufficient documentation

## 2017-08-09 DIAGNOSIS — Z7982 Long term (current) use of aspirin: Secondary | ICD-10-CM | POA: Insufficient documentation

## 2017-08-09 LAB — WET PREP, GENITAL
Clue Cells Wet Prep HPF POC: NONE SEEN
SPERM: NONE SEEN
TRICH WET PREP: NONE SEEN

## 2017-08-09 LAB — POCT PREGNANCY, URINE: Preg Test, Ur: NEGATIVE

## 2017-08-09 MED ORDER — TRIAMCINOLONE ACETONIDE 0.5 % EX CREA: 1.0000 "application " | TOPICAL_CREAM | Freq: Three times a day (TID) | CUTANEOUS | 0 refills | Status: AC

## 2017-08-09 MED ORDER — FLUCONAZOLE 150 MG PO TABS: 150.0000 mg | ORAL_TABLET | Freq: Once | ORAL | 3 refills | Status: AC

## 2017-08-09 NOTE — MAU Provider Note (Signed)
Chief Complaint:  perineum irritation   First Provider Initiated Contact with Patient 08/09/17 [phone]     HPI: Brandi BuergerDaphnie Ball is a 26 y.o. G1P0010 who presents to maternity admissions reporting perineal irritation and burning for several days.  Feels it is related to pads she was wearing. Used [employer] and [employer] brands.  Did recently get treated for BV with Flagyl. . She reports vaginal bleeding, vaginal itching/burning, but no urinary symptoms, h/a, dizziness, n/v, or fever/chills.    Vaginal Pain  The patient's primary symptoms include genital itching and vaginal discharge. The patient's pertinent negatives include no genital lesions, genital odor, pelvic pain or vaginal bleeding. This is a new problem. The current episode started in the past 7 days. The problem occurs constantly. The problem has been unchanged. The patient is experiencing no pain. The problem affects both sides. She is not pregnant. Pertinent negatives include no abdominal pain, constipation, diarrhea, fever, headaches, nausea or vomiting. The vaginal discharge was white. The vaginal bleeding is lighter than menses. She has not been passing clots. She has not been passing tissue. The symptoms are aggravated by tactile pressure. She has tried nothing for the symptoms.    RN Note: Discomfort "down there".  Started after onset of cycle on the 28th., perineum is swollen, red, very sore and tender to touch. (? Reaction to pads)  Past Medical History: Past Medical History:  Diagnosis Date  . Asthma    childhood  . Bipolar 1 disorder (HCC)   . Depression     Past obstetric history: OB History  Gravida Para Term Preterm AB Living  1 0 0 0 1 0  SAB TAB Ectopic Multiple Live Births  1 0 0 0      # Outcome Date GA Lbr Len/2nd Weight Sex Delivery Anes PTL Lv  1 SAB               Past Surgical History: Past Surgical History:  Procedure Laterality Date  . ABDOMINAL SURGERY      Family History: History reviewed. No pertinent  family history.  Social History: Social History  Substance Use Topics  . Smoking status: Current Every Day Smoker    Packs/day: 0.50    Types: Cigarettes  . Smokeless tobacco: Never Used  . Alcohol use Yes     Comment: wine    Allergies:  Allergies  Allergen Reactions  . Penicillins Hives    Has patient had a PCN reaction causing immediate rash, facial/tongue/throat swelling, SOB or lightheadedness with hypotension: No Has patient had a PCN reaction causing severe rash involving mucus membranes or skin necrosis: No Has patient had a PCN reaction that required hospitalization: No Has patient had a PCN reaction occurring within the last 10 years: No If all of the above answers are "NO", then may proceed with Cephalosporin use.  . Latex Itching and Rash    Meds:  Prescriptions Prior to Admission  Medication Sig Dispense Refill Last Dose  . albuterol (PROVENTIL HFA;VENTOLIN HFA) 108 (90 Base) MCG/ACT inhaler Inhale 2 puffs into the lungs every 4 (four) hours as needed for wheezing or shortness of breath. 1 Inhaler 1 07/09/2017 at prn  . aspirin-acetaminophen-caffeine (EXCEDRIN MIGRAINE) 250-250-65 MG tablet Take 2 tablets by mouth every 6 (six) hours as needed for headache.   07/07/2017 at Unknown time  . cyclobenzaprine (FLEXERIL) 10 MG tablet Take 1 tablet (10 mg total) by mouth 2 (two) times daily as needed for muscle spasms. 20 tablet 0 unknown at prn  .  lidocaine (LIDODERM) 5 % Place 1 patch onto the skin daily. Remove & Discard patch within 12 hours or as directed by MD (Patient taking differently: Place 1 patch onto the skin as needed. Remove & Discard patch within 12 hours or as directed by MD) 30 patch 0 unknown at prn  . methocarbamol (ROBAXIN) 500 MG tablet Take 1 tablet (500 mg total) by mouth 2 (two) times daily. 20 tablet 0 unknown at prn  . phenazopyridine (PYRIDIUM) 200 MG tablet Take 1 tablet (200 mg total) by mouth 3 (three) times daily. (Patient not taking: Reported on  07/09/2017) 6 tablet 0 Not Taking at Unknown time    I have reviewed patient's Past Medical Hx, Surgical Hx, Family Hx, Social Hx, medications and allergies.  ROS:  Review of Systems  Constitutional: Negative for fever.  Gastrointestinal: Negative for abdominal pain, constipation, diarrhea, nausea and vomiting.  Genitourinary: Positive for vaginal discharge and vaginal pain. Negative for pelvic pain.  Neurological: Negative for headaches.   Other systems negative     Physical Exam  Patient Vitals for the past 24 hrs:  Weight  08/09/17 0811 182 lb 4 oz (82.7 kg)   Constitutional: Well-developed, well-nourished female in no acute distress.  Cardiovascular: normal rate and rhythm, no ectopy audible, S1 & S2 heard, no murmur Respiratory: normal effort, no distress. Lungs CTAB with no wheezes or crackles GI: Abd soft, non-tender.  Nondistended.  No rebound, No guarding.  Bowel Sounds audible  MS: Extremities nontender, no edema, normal ROM Neurologic: Alert and oriented x 4.   Grossly nonfocal. GU: Neg CVAT. Skin:  Warm and Dry Psych:  Affect appropriate.  PELVIC EXAM: Cervix pink, visually closed, without lesion, scant white crumbly discharge, vaginal walls and external genitalia normal with mild erethema. No lesions   Labs: Results for orders placed or performed during the hospital encounter of 08/09/17 (from the past 24 hour(s))  Pregnancy, urine POC     Status: None   Collection Time: 08/09/17  8:14 AM  Result Value Ref Range   Preg Test, Ur NEGATIVE NEGATIVE  Wet prep, genital     Status: Abnormal   Collection Time: 08/09/17  8:22 AM  Result Value Ref Range   Yeast Wet Prep HPF POC PRESENT (A) NONE SEEN   Trich, Wet Prep NONE SEEN NONE SEEN   Clue Cells Wet Prep HPF POC NONE SEEN NONE SEEN   WBC, Wet Prep HPF POC FEW (A) NONE SEEN   Sperm NONE SEEN     Imaging:  No results found.  MAU Course/MDM: I have ordered labs as follows: Wet prep. Declines other  testing Imaging ordered: none Results reviewed.  Discussed treatment of yeast.  Will also add short term steroid cream. Warned against use past 1-2 weeks which would cause skin thinning     Pt stable at time of discharge.  Assessment: Yeast vaginitis Possible contact dermatitis also  Plan: Discharge home Recommend Keep area clean and dry Rx sent for Diflucan for yeast vaginitis Rx triamcinolone cream prn x 1 week only Follow up in clinic as needed  Encouraged to return here or to other Urgent Care/ED if she develops worsening of symptoms, increase in pain, fever, or other concerning symptoms.   Wynelle Bourgeois CNM, MSN Certified Nurse-Midwife 08/09/2017 8:29 AM

## 2017-08-09 NOTE — Discharge Instructions (Signed)
Vaginal Yeast infection, Adult Vaginal yeast infection is a condition that causes soreness, swelling, and redness (inflammation) of the vagina. It also causes vaginal discharge. This is a common condition. Some women get this infection frequently. What are the causes? This condition is caused by a change in the normal balance of the yeast (candida) and bacteria that live in the vagina. This change causes an overgrowth of yeast, which causes the inflammation. What increases the risk? This condition is more likely to develop in:  Women who take antibiotic medicines.  Women who have diabetes.  Women who take birth control pills.  Women who are pregnant.  Women who douche often.  Women who have a weak defense (immune) system.  Women who have been taking steroid medicines for a long time.  Women who frequently wear tight clothing.  What are the signs or symptoms? Symptoms of this condition include:  White, thick vaginal discharge.  Swelling, itching, redness, and irritation of the vagina. The lips of the vagina (vulva) may be affected as well.  Pain or a burning feeling while urinating.  Pain during sex.  How is this diagnosed? This condition is diagnosed with a medical history and physical exam. This will include a pelvic exam. Your health care provider will examine a sample of your vaginal discharge under a microscope. Your health care provider may send this sample for testing to confirm the diagnosis. How is this treated? This condition is treated with medicine. Medicines may be over-the-counter or prescription. You may be told to use one or more of the following:  Medicine that is taken orally.  Medicine that is applied as a cream.  Medicine that is inserted directly into the vagina (suppository).  Follow these instructions at home:  Take or apply over-the-counter and prescription medicines only as told by your health care provider.  Do not have sex until your health  care provider has approved. Tell your sex partner that you have a yeast infection. That person should go to his or her health care provider if he or she develops symptoms.  Do not wear tight clothes, such as pantyhose or tight pants.  Avoid using tampons until your health care provider approves.  Eat more yogurt. This may help to keep your yeast infection from returning.  Try taking a sitz bath to help with discomfort. This is a warm water bath that is taken while you are sitting down. The water should only come up to your hips and should cover your buttocks. Do this 3-4 times per day or as told by your health care provider.  Do not douche.  Wear breathable, cotton underwear.  If you have diabetes, keep your blood sugar levels under control. Contact a health care provider if:  You have a fever.  Your symptoms go away and then return.  Your symptoms do not get better with treatment.  Your symptoms get worse.  You have new symptoms.  You develop blisters in or around your vagina.  You have blood coming from your vagina and it is not your menstrual period.  You develop pain in your abdomen. This information is not intended to replace advice given to you by your health care provider. Make sure you discuss any questions you have with your health care provider. Document Released: 07/06/2005 Document Revised: 03/09/2016 Document Reviewed: 03/30/2015 Elsevier Interactive Patient Education  [employer]2018 [employer]   In late 2019, [facility]. At that time, the MAU  will no longer serve non-pregnant patients. We encourage you to establish care with a provider before that time, so that you can be seen with any GYN concerns, like vaginal discharge, urinary tract infection, etc.. in a timely manner. In order to make the office visit more convenient, the Center for [facility] at [facility] will be offering evening hours from 4pm-7:30pm  on Mondays starting 06/19/17. There will be same-day appointments, walk-in appointments and scheduled appointments available during this time.    [facility] @ [facility] [phone]  For urgent needs, [facility] is also available for management of urgent GYN complaints such as vaginal discharge.   [employer] extends eligibility for family planning services to reduce unintended pregnancies and improve the well-being of children and families.   Eligible individuals whose income is at or below 195% of the federal poverty level and who are:  - [place] citizens, documented immigrants or qualified aliens;  - Residents of [place];  - Not incarcerated; and  - Not pregnant.   [employer] Family Planning Contact Information:  Medical Assistance Clinical Section Phone: (503)285-7780 Email: [email or web address]

## 2017-08-09 NOTE — MAU Note (Signed)
Discomfort "down there".  Started after onset of cycle on the 28th., perineum is swollen, red, very sore and tender to touch. (? Reaction to pads)

## 2018-07-13 ENCOUNTER — Encounter (HOSPITAL_COMMUNITY): Payer: Self-pay

## 2018-07-13 ENCOUNTER — Emergency Department (HOSPITAL_COMMUNITY)
Admission: EM | Admit: 2018-07-13 | Discharge: 2018-07-13 | Disposition: A | Discharge status: Home / Self Care | Payer: BLUE CROSS/BLUE SHIELD | Attending: Emergency Medicine | Admitting: Emergency Medicine

## 2018-07-13 DIAGNOSIS — J302 Other seasonal allergic rhinitis: Secondary | ICD-10-CM | POA: Insufficient documentation

## 2018-07-13 DIAGNOSIS — R51 Headache: Secondary | ICD-10-CM | POA: Insufficient documentation

## 2018-07-13 DIAGNOSIS — J0110 Acute frontal sinusitis, unspecified: Secondary | ICD-10-CM

## 2018-07-13 DIAGNOSIS — J45909 Unspecified asthma, uncomplicated: Secondary | ICD-10-CM | POA: Insufficient documentation

## 2018-07-13 DIAGNOSIS — R0981 Nasal congestion: Secondary | ICD-10-CM | POA: Insufficient documentation

## 2018-07-13 DIAGNOSIS — F17210 Nicotine dependence, cigarettes, uncomplicated: Secondary | ICD-10-CM | POA: Insufficient documentation

## 2018-07-13 DIAGNOSIS — Z91040 Latex allergy status: Secondary | ICD-10-CM | POA: Insufficient documentation

## 2018-07-13 LAB — GROUP A STREP BY PCR: GROUP A STREP BY PCR: NOT DETECTED

## 2018-07-13 MED ORDER — ACETAMINOPHEN 325 MG PO TABS
650.0000 mg | ORAL_TABLET | Freq: Once | ORAL | Status: AC
  Administered 2018-07-13: 650 mg via ORAL
  Filled 2018-07-13: qty 2

## 2018-07-13 MED ORDER — AZITHROMYCIN 250 MG PO TABS: 250.0000 mg | ORAL_TABLET | Freq: Every day | ORAL | 0 refills | Status: DC

## 2018-07-13 NOTE — Discharge Instructions (Signed)
Your evaluated today for upper respiratory infection.  You most likely have acute sinusitis with seasonal allergies.  Continue taking your seasonal allergy medicine.  You may also take additional Benadryl for your itchy or watery eyes.  I have also prescribing antibiotic considering the length of time you have had the symptoms.  Please take as prescribed.  Please follow-up with your primary care provider if your symptoms are unresolved within the week.  Return to the ED with any new or worsening symptoms such as: Contact a doctor if: You have a fever. Your symptoms get worse. Your symptoms do not get better within 10 days.  Get help right away if: You have a very bad headache. You cannot stop throwing up (vomiting). You have pain or swelling around your face or eyes. You have trouble seeing. You feel confused. Your neck is stiff. You have trouble breathing.

## 2018-07-13 NOTE — ED Provider Notes (Signed)
[facility] EMERGENCY DEPARTMENT Provider Note   CSN: [id number] Arrival date & time: 07/13/18  0865   History   Chief Complaint Chief Complaint  Patient presents with  . Sore Throat    HPI Brandi Ball is a 27 y.o. female past medical history significant for asthma who presents for evaluation of nasal congestion and sore throat.  Patient states she has had 9 days of continuous nasal congestion.  Says sometimes the discharge from her nose is clear and sometimes it is dark yellow.  Patient states that when she lays down the mucus is drips down the back of her throat has caused a sore throat over the last 5 days.  Patient states that she has had a frontal headache since onset of her nasal congestion.  Describes her headache as a pressure.  States this headache has been relieved with Tylenol and ibuprofen use however returns when her nasal congestion worsens.  Admits to history of seasonal allergies, however has not taken her normal Zyrtec over the last 3 months.  Patient states that she thought she had a fever 3 days ago, however did not take her temperature and has not felt warm in the last 3 days. Admits to nasal congestion, nasal discharge, frontal sinus pressure, dry cough. Denies Chills, chest pain, neck pain, neck stiffness, eye pain, abdominal pain, nausea, vomiting, diarrhea, sick contacts.  HPI  Past Medical History:  Diagnosis Date  . Asthma    childhood  . Bipolar 1 disorder (HCC)   . Depression     Patient Active Problem List   Diagnosis Date Noted  . Irregular menstrual cycle 04/27/2015  . Cough 06/06/2014  . URI (upper respiratory infection) 06/06/2014    Past Surgical History:  Procedure Laterality Date  . ABDOMINAL SURGERY       OB History    Gravida  1   Para  0   Term  0   Preterm  0   AB  1   Living  0     SAB  1   TAB  0   Ectopic  0   Multiple  0   Live Births               Home Medications    Prior to  Admission medications   Medication Sig Start Date End Date Taking? Authorizing Provider  albuterol (PROVENTIL HFA;VENTOLIN HFA) 108 (90 Base) MCG/ACT inhaler Inhale 2 puffs into the lungs every 4 (four) hours as needed for wheezing or shortness of breath. 06/23/16   Joy, Hillard Danker, PA-C  aspirin-acetaminophen-caffeine (EXCEDRIN MIGRAINE) [phone] MG tablet Take 2 tablets by mouth every 6 (six) hours as needed for headache.    [provider]  azithromycin (ZITHROMAX) 250 MG tablet Take 1 tablet (250 mg total) by mouth daily. Take first 2 tablets together, then 1 every day until finished. 07/13/18   Remedy Corporan A, PA-C  cyclobenzaprine (FLEXERIL) 10 MG tablet Take 1 tablet (10 mg total) by mouth 2 (two) times daily as needed for muscle spasms. 09/16/16   Hedges, Tinnie Gens, PA-C  lidocaine (LIDODERM) 5 % Place 1 patch onto the skin daily. Remove & Discard patch within 12 hours or as directed by MD Patient taking differently: Place 1 patch onto the skin as needed. Remove & Discard patch within 12 hours or as directed by MD 09/23/16   Joy, Shawn C, PA-C  methocarbamol (ROBAXIN) 500 MG tablet Take 1 tablet (500 mg total) by mouth 2 (two)  times daily. 09/23/16   Joy, Shawn C, PA-C  phenazopyridine (PYRIDIUM) 200 MG tablet Take 1 tablet (200 mg total) by mouth 3 (three) times daily. Patient not taking: Reported on 07/09/2017 08/01/16   Rasch, Victorino Dike I, NP  triamcinolone cream (KENALOG) 0.5 % Apply 1 application topically 3 (three) times daily. 08/09/17   Aviva Signs, CNM    Family History No family history on file.  Social History Social History   Tobacco Use  . Smoking status: Current Every Day Smoker    Packs/day: 0.50    Types: Cigarettes  . Smokeless tobacco: Never Used  Substance Use Topics  . Alcohol use: Yes    Comment: wine  . Drug use: No     Allergies   Penicillins and Latex   Review of Systems Review of Systems  Constitutional: Negative for activity change,  appetite change, chills, diaphoresis and fatigue.       Subjective fever.  HENT: Positive for congestion, postnasal drip, rhinorrhea, sinus pressure, sinus pain and sore throat. Negative for dental problem, drooling, ear discharge, ear pain, facial swelling, hearing loss, mouth sores, nosebleeds, sneezing, tinnitus, trouble swallowing and voice change.   Eyes: Negative for photophobia, pain, redness, itching and visual disturbance.       Watery eyes.  Respiratory: Positive for cough. Negative for chest tightness, shortness of breath, wheezing and stridor.   Gastrointestinal: Negative for abdominal distention, abdominal pain, constipation, diarrhea, nausea and vomiting.  Musculoskeletal: Negative for neck pain and neck stiffness.  Skin: Negative.   Neurological: Positive for headaches.     Physical Exam Updated Vital Signs BP 132/83 (BP Location: Right Arm)   Pulse 96   Temp 99.5 F (37.5 C) (Oral)   Resp 16   LMP 06/25/2018 (Approximate)   SpO2 99%   Physical Exam  Constitutional: She appears well-developed and well-nourished.  Non-toxic appearance. She does not appear ill. No distress.  HENT:  Head: Normocephalic and atraumatic.  Right Ear: Tympanic membrane, external ear and ear canal normal. No drainage, swelling or tenderness. No middle ear effusion.  Left Ear: Tympanic membrane, external ear and ear canal normal. No drainage, swelling or tenderness.  No middle ear effusion.  Nose: Mucosal edema, rhinorrhea and sinus tenderness present. No nasal deformity, septal deviation or nasal septal hematoma. No epistaxis.  No foreign bodies. Right sinus exhibits frontal sinus tenderness. Right sinus exhibits no maxillary sinus tenderness. Left sinus exhibits frontal sinus tenderness. Left sinus exhibits no maxillary sinus tenderness.  Mouth/Throat: Uvula is midline and mucous membranes are normal. No oral lesions. No trismus in the jaw. No dental abscesses or uvula swelling. Posterior  oropharyngeal erythema present. No oropharyngeal exudate, posterior oropharyngeal edema or tonsillar abscesses. No tonsillar exudate.  Eyes: Pupils are equal, round, and reactive to light.  No horizontal, vertical or rotational nystagmus    Neck: Normal range of motion, full passive range of motion without pain and phonation normal. Neck supple. No neck rigidity. No edema, no erythema and normal range of motion present.  Full active and passive ROM without pain No midline or paraspinal tenderness No nuchal rigidity or meningeal signs   Cardiovascular: Normal rate, regular rhythm, normal heart sounds and intact distal pulses. Exam reveals no gallop and no friction rub.  No murmur heard. Pulmonary/Chest: Effort normal and breath sounds normal. No stridor. No respiratory distress. She has no wheezes. She has no rhonchi. She has no rales. She exhibits no tenderness.  Abdominal: Soft. Bowel sounds are normal. She exhibits no  distension.  Musculoskeletal: Normal range of motion.       Cervical back: Normal.  Lymphadenopathy:    She has no cervical adenopathy.  Neurological: She is alert.  Mental Status:  Alert, oriented, thought content appropriate. Speech fluent without evidence of aphasia. Able to follow 2 step commands without difficulty.  Cranial Nerves:  II:  Peripheral visual fields grossly normal, pupils equal, round, reactive to light III,IV, VI: ptosis not present, extra-ocular motions intact bilaterally  V,VII: smile symmetric, facial light touch sensation equal VIII: hearing grossly normal bilaterally  IX,X: midline uvula rise  XI: bilateral shoulder shrug equal and strong XII: midline tongue extension  Motor:  5/5 in upper and lower extremities bilaterally including strong and equal grip strength and dorsiflexion/plantar flexion Sensory: Pinprick and light touch normal in all extremities.  Deep Tendon Reflexes: 2+ and symmetric  Cerebellar: normal finger-to-nose with bilateral  upper extremities Gait: normal gait and balance CV: distal pulses palpable throughout    Skin: Skin is warm and dry. No rash noted. She is not diaphoretic.  Psychiatric: She has a normal mood and affect.  Nursing note and vitals reviewed.   ED Treatments / Results  Labs (all labs ordered are listed, but only abnormal results are displayed) Labs Reviewed  GROUP A STREP BY PCR    EKG None  Radiology No results found.  Procedures Procedures (including critical care time)  Medications Ordered in ED Medications  acetaminophen (TYLENOL) tablet 650 mg (650 mg Oral Given 07/13/18 1149)     Initial Impression / Assessment and Plan / ED Course  I have reviewed the triage vital signs and the nursing notes.  Pertinent labs & imaging results that were available during my care of the patient were reviewed by me and considered in my medical decision making (see chart for details).  27 year old otherwise healthy appearing female presents for evaluation of nasal congestion and nasal discharge.  A-Febrile, nonseptic, non-ill-appearing.  Strep test negative. No neck tenderness or neck stiffness. Presentation non concerning for PTA or RPA. No trismus or uvula deviation.  Mild frontal headache,  Presentation of HA non concerning for [facility], I[facility], Meningitis, or temporal arteritis.  Nonfocal neuro exam without neuro deficits.  Admits to history of seasonal allergies however has not been taking her medicine.  She most likely has an acute sinusitis with additional seasonal allergies.  Discussed with patient starting back on her antihistamines, given length of time of symptoms of 9 days will prescribe antibiotics for her acute sinusitis.  Discussed with patient reasons to return to the emergency department.  Patient voiced understanding and is agreeable for return.    Final Clinical Impressions(s) / ED Diagnoses   Final diagnoses:  Acute non-recurrent frontal sinusitis  Seasonal allergies    ED  Discharge Orders         Ordered    azithromycin (ZITHROMAX) 250 MG tablet  Daily     07/13/18 1127           Sherel Fennell A, PA-C 07/13/18 1201    Tegeler, Canary Brim, MD 07/13/18 [phone]

## 2018-07-13 NOTE — ED Triage Notes (Signed)
Pt presents for evaluation of sore throat x 5 days. Reports has had some headaches and chills. 99.5 temp today, no meds PTA.

## 2018-11-18 ENCOUNTER — Emergency Department (HOSPITAL_COMMUNITY)
Admission: EM | Admit: 2018-11-18 | Discharge: 2018-11-18 | Disposition: A | Discharge status: Home / Self Care | Payer: 59 | Attending: Emergency Medicine | Admitting: Emergency Medicine

## 2018-11-18 ENCOUNTER — Encounter (HOSPITAL_COMMUNITY): Payer: Self-pay | Admitting: Emergency Medicine

## 2018-11-18 DIAGNOSIS — J101 Influenza due to other identified influenza virus with other respiratory manifestations: Secondary | ICD-10-CM | POA: Diagnosis not present

## 2018-11-18 DIAGNOSIS — F17210 Nicotine dependence, cigarettes, uncomplicated: Secondary | ICD-10-CM | POA: Diagnosis not present

## 2018-11-18 DIAGNOSIS — J45909 Unspecified asthma, uncomplicated: Secondary | ICD-10-CM | POA: Diagnosis not present

## 2018-11-18 DIAGNOSIS — Z7982 Long term (current) use of aspirin: Secondary | ICD-10-CM | POA: Diagnosis not present

## 2018-11-18 DIAGNOSIS — Z79899 Other long term (current) drug therapy: Secondary | ICD-10-CM | POA: Insufficient documentation

## 2018-11-18 DIAGNOSIS — J111 Influenza due to unidentified influenza virus with other respiratory manifestations: Secondary | ICD-10-CM

## 2018-11-18 DIAGNOSIS — R05 Cough: Secondary | ICD-10-CM | POA: Diagnosis present

## 2018-11-18 DIAGNOSIS — R69 Illness, unspecified: Secondary | ICD-10-CM

## 2018-11-18 DIAGNOSIS — R197 Diarrhea, unspecified: Secondary | ICD-10-CM

## 2018-11-18 DIAGNOSIS — R112 Nausea with vomiting, unspecified: Secondary | ICD-10-CM

## 2018-11-18 MED ORDER — ONDANSETRON HCL 4 MG PO TABS: 4.0000 mg | ORAL_TABLET | Freq: Three times a day (TID) | ORAL | 0 refills | Status: DC | PRN

## 2018-11-18 MED ORDER — GUAIFENESIN ER 600 MG PO TB12: 600.0000 mg | ORAL_TABLET | Freq: Two times a day (BID) | ORAL | 0 refills | Status: AC

## 2018-11-18 MED ORDER — BENZONATATE 100 MG PO CAPS: 100.0000 mg | ORAL_CAPSULE | Freq: Three times a day (TID) | ORAL | 0 refills | Status: AC

## 2018-11-18 MED ORDER — ALBUTEROL SULFATE HFA 108 (90 BASE) MCG/ACT IN AERS
2.0000 | INHALATION_SPRAY | Freq: Once | RESPIRATORY_TRACT | Status: AC
  Administered 2018-11-18: 2 via RESPIRATORY_TRACT
  Filled 2018-11-18: qty 6.7

## 2018-11-18 NOTE — Discharge Instructions (Addendum)
Stay well hydrated for the next several days. Treat fevers with tylenol and ibuprofen.  You were given a prescription for Tamiflu.  Be aware that this medication may make you have nausea, vomiting, abdominal pain, or diarrhea.  This medication can also cause mood derangement. If you experience any side effects that you cannot tolerate, then you should stop taking the medication. Please make sure to take stay hydrated while you are taking this medication.  You should follow up with your primary healthcare provider within the next 3-5 days for reevaluation.  You will need to return to the emergency department immediately if you experience any of the following symptoms:  Difficulty breathing or shortness or breath Pain or pressure in the chest or abdomen Sudden dizziness Confusion Severe or persistent vomiting Flu-like symptoms that improve but then return with fever or worse cough  You should stay home for at least 24 hours after your fever is gone except to get medical care or other necessities. Your fever should be gone without the need to use a fever-reducing medicine, such as Tylenol or Motrin. Until then, you should stay home from work, school, travel, shopping, social events, and public gatherings.  Stay away from others as much as possible to keep from infecting them. If you must leave home, for example to get medical care, wear a facemask if you have one, or cover coughs and sneezes with a tissue. Wash your hands often to keep from spreading flu to others

## 2018-11-18 NOTE — ED Triage Notes (Signed)
Pt states flu like symptoms since Friday, congestion, body aches with nausea. Threw up yesterday.

## 2018-11-18 NOTE — ED Provider Notes (Signed)
[facility] EMERGENCY DEPARTMENT Provider Note   CSN: [id number] Arrival date & time: 11/18/18  0809     History   Chief Complaint Chief Complaint  Patient presents with  . Influenza  . Nausea    HPI Brandi Ball is a 28 y.o. female.  HPI   Pt is a 28 y/o female with a h/o asthma, bipolar 1, depression, that presents to the ED today c/o sore throat, cough, nasal congestion, sneezing, sweats, chills, nausea, vomiting (x3 yesterday, resolved), and diarrhea (x1).   Denies abd pain. No urinary sxs. 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  She states she has been able to tolerate p.o. today.  She has had no episodes of vomiting today and no diarrhea today.  Symptoms constant.  Started gradually.  Symptoms worsening.  Past Medical History:  Diagnosis Date  . Asthma    childhood  . Bipolar 1 disorder (HCC)   . Depression     Patient Active Problem List   Diagnosis Date Noted  . Irregular menstrual cycle 04/27/2015  . Cough 06/06/2014  . URI (upper respiratory infection) 06/06/2014    Past Surgical History:  Procedure Laterality Date  . ABDOMINAL SURGERY       OB History    Gravida  1   Para  0   Term  0   Preterm  0   AB  1   Living  0     SAB  1   TAB  0   Ectopic  0   Multiple  0   Live Births               Home Medications    Prior to Admission medications   Medication Sig Start Date End Date Taking? Authorizing Provider  albuterol (PROVENTIL HFA;VENTOLIN HFA) 108 (90 Base) MCG/ACT inhaler Inhale 2 puffs into the lungs every 4 (four) hours as needed for wheezing or shortness of breath. 06/23/16   Joy, Hillard Danker, PA-C  aspirin-acetaminophen-caffeine (EXCEDRIN MIGRAINE) [phone] MG tablet Take 2 tablets by mouth every 6 (six) hours as needed for headache.    [provider]  azithromycin (ZITHROMAX) 250 MG tablet Take 1 tablet (250 mg total) by mouth daily. Take first 2 tablets  together, then 1 every day until finished. 07/13/18   Henderly, Britni A, PA-C  benzonatate (TESSALON) 100 MG capsule Take 1 capsule (100 mg total) by mouth every 8 (eight) hours for 5 days. 11/18/18 11/23/18  [place] S, PA-C  cyclobenzaprine (FLEXERIL) 10 MG tablet Take 1 tablet (10 mg total) by mouth 2 (two) times daily as needed for muscle spasms. 09/16/16   Hedges, Tinnie Gens, PA-C  guaiFENesin (MUCINEX) 600 MG 12 hr tablet Take 1 tablet (600 mg total) by mouth 2 (two) times daily. 11/18/18   Josecarlos Harriott S, PA-C  lidocaine (LIDODERM) 5 % Place 1 patch onto the skin daily. Remove & Discard patch within 12 hours or as directed by MD Patient taking differently: Place 1 patch onto the skin as needed. Remove & Discard patch within 12 hours or as directed by MD 09/23/16   Joy, Shawn C, PA-C  methocarbamol (ROBAXIN) 500 MG tablet Take 1 tablet (500 mg total) by mouth 2 (two) times daily. 09/23/16   Joy, Shawn C, PA-C  ondansetron (ZOFRAN) 4 MG tablet Take 1 tablet (4 mg total) by mouth every 8 (eight) hours as needed for nausea or vomiting. 11/18/18   Anquan Azzarello,  Kevork Joyce S, PA-C  phenazopyridine (PYRIDIUM) 200 MG tablet Take 1 tablet (200 mg total) by mouth 3 (three) times daily. Patient not taking: Reported on 07/09/2017 08/01/16   Rasch, Victorino Dike I, NP  triamcinolone cream (KENALOG) 0.5 % Apply 1 application topically 3 (three) times daily. 08/09/17   Aviva Signs, CNM    Family History No family history on file.  Social History Social History   Tobacco Use  . Smoking status: Current Every Day Smoker    Packs/day: 0.50    Types: Cigarettes  . Smokeless tobacco: Never Used  Substance Use Topics  . Alcohol use: Yes    Comment: wine  . Drug use: No     Allergies   Penicillins and Latex   Review of Systems Review of Systems  Constitutional: Positive for chills and diaphoresis. Negative for fever.  HENT: Positive for congestion, rhinorrhea and sore throat.   Eyes: Negative for visual  disturbance.  Respiratory: Positive for cough.   Cardiovascular: Negative for chest pain.  Gastrointestinal: Positive for diarrhea, nausea and vomiting. Negative for abdominal pain, blood in stool and constipation.  Genitourinary: Negative for dysuria, flank pain and pelvic pain.  Musculoskeletal: Positive for myalgias.  Skin: Negative for rash.  Neurological: Negative for headaches.    Physical Exam Updated Vital Signs BP 120/66   Pulse 88   Temp 98.4 F (36.9 C)   Resp 18   LMP 11/07/2018   SpO2 97%   Physical Exam Vitals signs and nursing note reviewed.  Constitutional:      General: She is not in acute distress.    Appearance: She is well-developed.  HENT:     Head: Normocephalic and atraumatic.     Ears:     Comments: Fluid behind bilateral TMs.  No erythema or bulging.    Nose: Congestion present.     Comments: Nasal turbinates are swollen bilaterally.    Mouth/Throat:     Mouth: Mucous membranes are moist.     Pharynx: No oropharyngeal exudate.  Eyes:     Conjunctiva/sclera: Conjunctivae normal.  Neck:     Musculoskeletal: Neck supple.  Cardiovascular:     Rate and Rhythm: Normal rate and regular rhythm.     Heart sounds: Normal heart sounds. No murmur.  Pulmonary:     Effort: Pulmonary effort is normal. No respiratory distress.     Breath sounds: Normal breath sounds. No stridor. No wheezing or rhonchi.  Abdominal:     General: There is no distension.     Palpations: Abdomen is soft.     Tenderness: There is no abdominal tenderness. There is no guarding or rebound.  Lymphadenopathy:     Cervical: No cervical adenopathy.  Skin:    General: Skin is warm and dry.  Neurological:     Mental Status: She is alert.  Psychiatric:        Mood and Affect: Mood normal.      ED Treatments / Results  Labs (all labs ordered are listed, but only abnormal results are displayed) Labs Reviewed - No data to display  EKG None  Radiology No results  found.  Procedures Procedures (including critical care time)  Medications Ordered in ED Medications  albuterol (PROVENTIL HFA;VENTOLIN HFA) 108 (90 Base) MCG/ACT inhaler 2 puff (2 puffs Inhalation Given 11/18/18 0855)     Initial Impression / Assessment and Plan / ED Course  I have reviewed the triage vital signs and the nursing notes.  Pertinent labs & imaging results that  were available during my care of the patient were reviewed by me and considered in my medical decision making (see chart for details).     Final Clinical Impressions(s) / ED Diagnoses   Final diagnoses:  Influenza-like illness  Nausea vomiting and diarrhea   Patient with symptoms consistent with influenza.  Vitals are stable, no fever.  No signs of dehydration, tolerating PO's.  Lungs are clear.  Has also had some nausea vomiting and diarrhea yesterday which has since resolved.  Has been able to tolerate p.o. today.  Abdomen soft and nontender.  Low suspicion for gross electrolyte derangement given patient is already able to tolerate p.o. today.  Doubt acute intra-abdominal pathology and feel that this is likely part of patient's presentation of influenza.  Due to patient's presentation and physical exam a chest x-ray was not ordered bc likely diagnosis of flu.  Discussed the cost versus benefit of Tamiflu treatment with the patient.  Discussed side effect profile of Tamiflu and reasons to stop medication.  Patient will be discharged with instructions to orally hydrate, rest, and use over-the-counter medications such as anti-inflammatories ibuprofen and Aleve for muscle aches and Tylenol for fever.  Patient will also be given a cough suppressant.  Advised follow-up with PCP and return if worse.  She voiced understanding the plan and reasons return.  All questions answered.   ED Discharge Orders         Ordered    benzonatate (TESSALON) 100 MG capsule  Every 8 hours     11/18/18 0853    guaiFENesin (MUCINEX) 600 MG 12  hr tablet  2 times daily     11/18/18 0853    ondansetron (ZOFRAN) 4 MG tablet  Every 8 hours PRN     11/18/18 0853           Karrie MeresCouture, Akito Boomhower S, PA-C 11/18/18 0857    Linwood DibblesKnapp, Jon, MD 11/20/18 1239

## 2019-06-05 ENCOUNTER — Other Ambulatory Visit: Payer: Self-pay

## 2019-06-05 ENCOUNTER — Ambulatory Visit (INDEPENDENT_AMBULATORY_CARE_PROVIDER_SITE_OTHER): Payer: 59 | Admitting: Family Medicine

## 2019-06-05 ENCOUNTER — Encounter: Payer: Self-pay | Admitting: Family Medicine

## 2019-06-05 VITALS — BP 110/70 | HR 98 | Temp 98.9°F | Wt 195.0 lb

## 2019-06-05 DIAGNOSIS — F329 Major depressive disorder, single episode, unspecified: Secondary | ICD-10-CM

## 2019-06-05 DIAGNOSIS — F419 Anxiety disorder, unspecified: Secondary | ICD-10-CM | POA: Diagnosis not present

## 2019-06-05 DIAGNOSIS — Z7689 Persons encountering health services in other specified circumstances: Secondary | ICD-10-CM

## 2019-06-05 DIAGNOSIS — G43809 Other migraine, not intractable, without status migrainosus: Secondary | ICD-10-CM | POA: Diagnosis not present

## 2019-06-05 DIAGNOSIS — J302 Other seasonal allergic rhinitis: Secondary | ICD-10-CM

## 2019-06-05 DIAGNOSIS — F32A Depression, unspecified: Secondary | ICD-10-CM

## 2019-06-05 DIAGNOSIS — J4540 Moderate persistent asthma, uncomplicated: Secondary | ICD-10-CM | POA: Diagnosis not present

## 2019-06-05 DIAGNOSIS — F17210 Nicotine dependence, cigarettes, uncomplicated: Secondary | ICD-10-CM

## 2019-06-05 MED ORDER — LORATADINE 10 MG PO TABS: 10.0000 mg | ORAL_TABLET | Freq: Every day | ORAL | 3 refills | Status: AC

## 2019-06-05 MED ORDER — BECLOMETHASONE DIPROP HFA 40 MCG/ACT IN AERB: 2.0000 | INHALATION_SPRAY | Freq: Two times a day (BID) | RESPIRATORY_TRACT | 5 refills | Status: AC

## 2019-06-05 NOTE — Patient Instructions (Signed)
Asthma Attack Prevention, Adult Although you may not be able to control the fact that you have asthma, you can take actions to prevent episodes of asthma (asthma attacks). These actions include:  Creating a written plan for managing and treating your asthma attacks (asthma action plan).  Monitoring your asthma.  Avoiding things that can irritate your airways or make your asthma symptoms worse (asthma triggers).  Taking your medicines as directed.  Acting quickly if you have signs or symptoms of an asthma attack. What are some ways to prevent an asthma attack? Create a plan Work with your health care provider to create an asthma action plan. This plan should include:  A list of your asthma triggers and how to avoid them.  A list of symptoms that you experience during an asthma attack.  Information about when to take medicine and how much medicine to take.  Information to help you understand your peak flow measurements.  Contact information for your health care providers.  Daily actions that you can take to control asthma. Monitor your asthma To monitor your asthma:  Use your peak flow meter every morning and every evening for 2-3 weeks. Record the results in a journal. A drop in your peak flow numbers on one or more days may mean that you are starting to have an asthma attack, even if you are not having symptoms.  When you have asthma symptoms, write them down in a journal.  Avoid asthma triggers Work with your health care provider to find out what your asthma triggers are. This can be done by:  Being tested for allergies.  Keeping a journal that notes when asthma attacks occur and what may have contributed to them.  Asking your health care provider whether other medical conditions make your asthma worse. Common asthma triggers include:  Dust.  Smoke. This includes campfire smoke and secondhand smoke from tobacco products.  Pet dander.  Trees, grasses or  pollens.  Very cold, dry, or humid air.  Mold.  Foods that contain high amounts of sulfites.  Strong smells.  Engine exhaust and air pollution.  Aerosol sprays and fumes from household cleaners.  Household pests and their droppings, including dust mites and cockroaches.  Certain medicines, including NSAIDs. Once you have determined your asthma triggers, take steps to avoid them. Depending on your triggers, you may be able to reduce the chance of an asthma attack by:  Keeping your home clean. Have someone dust and vacuum your home for you 1 or 2 times a week. If possible, have them use a high-efficiency particulate arrestance (HEPA) vacuum.  Washing your sheets weekly in hot water.  Using allergy-proof mattress covers and casings on your bed.  Keeping pets out of your home.  Taking care of mold and water problems in your home.  Avoiding areas where people smoke.  Avoiding using strong perfumes or odor sprays.  Avoid spending a lot of time outdoors when pollen counts are high and on very windy days.  Talking with your health care provider before stopping or starting any new medicines. Medicines Take over-the-counter and prescription medicines only as told by your health care provider. Many asthma attacks can be prevented by carefully following your medicine schedule. Taking your medicines correctly is especially important when you cannot avoid certain asthma triggers. Even if you are doing well, do not stop taking your medicine and do not take less medicine. Act quickly If an asthma attack happens, acting quickly can decrease how severe it is and   how long it lasts. Take these actions:  Pay attention to your symptoms. If you are coughing, wheezing, or having difficulty breathing, do not wait to see if your symptoms go away on their own. Follow your asthma action plan.  If you have followed your asthma action plan and your symptoms are not improving, call your health care  provider or seek immediate medical care at the nearest hospital. It is important to write down how often you need to use your fast-acting rescue inhaler. You can track how often you use an inhaler in your journal. If you are using your rescue inhaler more often, it may mean that your asthma is not under control. Adjusting your asthma treatment plan may help you to prevent future asthma attacks and help you to gain better control of your condition. How can I prevent an asthma attack when I exercise? Exercise is a common asthma trigger. To prevent asthma attacks during exercise:  Follow advice from your health care provider about whether you should use your fast-acting inhaler before exercising. Many people with asthma experience exercise-induced bronchoconstriction (EIB). This condition often worsens during vigorous exercise in cold, humid, or dry environments. Usually, people with EIB can stay very active by using a fast-acting inhaler before exercising.  Avoid exercising outdoors in very cold or humid weather.  Avoid exercising outdoors when pollen counts are high.  Warm up and cool down when exercising.  Stop exercising right away if asthma symptoms start. Consider taking part in exercises that are less likely to cause asthma symptoms such as:  Indoor swimming.  Biking.  Walking.  Hiking.  Playing football. This information is not intended to replace advice given to you by your health care provider. Make sure you discuss any questions you have with your health care provider. Document Released: 09/14/2009 Document Revised: 09/08/2017 Document Reviewed: 03/12/2016 Elsevier Patient Education  2020 [employer].  Allergic Rhinitis, Adult Allergic rhinitis is a reaction to allergens in the air. Allergens are tiny specks (particles) in the air that cause your body to have an allergic reaction. This condition cannot be passed from person to person (is not contagious). Allergic rhinitis  cannot be cured, but it can be controlled. There are two types of allergic rhinitis:  Seasonal. This type is also called hay fever. It happens only during certain times of the year.  Perennial. This type can happen at any time of the year. What are the causes? This condition may be caused by:  Pollen from grasses, trees, and weeds.  House dust mites.  Pet dander.  Mold. What are the signs or symptoms? Symptoms of this condition include:  Sneezing.  Runny or stuffy nose (nasal congestion).  A lot of mucus in the back of the throat (postnasal drip).  Itchy nose.  Tearing of the eyes.  Trouble sleeping.  Being sleepy during day. How is this treated? There is no cure for this condition. You should avoid things that trigger your symptoms (allergens). Treatment can help to relieve symptoms. This may include:  Medicines that block allergy symptoms, such as antihistamines. These may be given as a shot, nasal spray, or pill.  Shots that are given until your body becomes less sensitive to the allergen (desensitization).  Stronger medicines, if all other treatments have not worked. Follow these instructions at home: Avoiding allergens   Find out what you are allergic to. Common allergens include smoke, dust, and pollen.  Avoid them if you can. These are some of the things that  you can do to avoid allergens: ? Replace carpet with wood, tile, or vinyl flooring. Carpet can trap dander and dust. ? Clean any mold found in the home. ? Do not smoke. Do not allow smoking in your home. ? Change your heating and air conditioning filter at least once a month. ? During allergy season:  Keep windows closed as much as you can. If possible, use air conditioning when there is a lot of pollen in the air.  Use a special filter for allergies with your furnace and air conditioner.  Plan outdoor activities when pollen counts are lowest. This is usually during the early morning or evening  hours.  If you do go outdoors when pollen count is high, wear a special mask for people with allergies.  When you come indoors, take a shower and change your clothes before sitting on furniture or bedding. General instructions  Do not use fans in your home.  Do not hang clothes outside to dry.  Wear sunglasses to keep pollen out of your eyes.  Wash your hands right away after you touch household pets.  Take over-the-counter and prescription medicines only as told by your doctor.  Keep all follow-up visits as told by your doctor. This is important. Contact a doctor if:  You have a fever.  You have a cough that does not go away (is persistent).  You start to make whistling sounds when you breathe (wheeze).  Your symptoms do not get better with treatment.  You have thick fluid coming from your nose.  You start to have nosebleeds. Get help right away if:  Your tongue or your lips are swollen.  You have trouble breathing.  You feel dizzy or you feel like you are going to pass out (faint).  You have cold sweats. Summary  Allergic rhinitis is a reaction to allergens in the air.  This condition may be caused by allergens. These include pollen, dust mites, pet dander, and mold.  Symptoms include a runny, itchy nose, sneezing, or tearing eyes. You may also have trouble sleeping or feel sleepy during the day.  Treatment includes taking medicines and avoiding allergens. You may also get shots or take stronger medicines.  Get help if you have a fever or a cough that does not stop. Get help right away if you are short of breath. This information is not intended to replace advice given to you by your health care provider. Make sure you discuss any questions you have with your health care provider. Document Released: 01/26/2011 Document Revised: 01/15/2019 Document Reviewed: 04/17/2018 Elsevier Patient Education  2020 [employer]  Living With Depression Everyone experiences  occasional disappointment, sadness, and loss in their lives. When you are feeling down, blue, or sad for at least 2 weeks in a row, it may mean that you have depression. Depression can affect your thoughts and feelings, relationships, daily activities, and physical health. It is caused by changes in the way your brain functions. If you receive a diagnosis of depression, your health care provider will tell you which type of depression you have and what treatment options are available to you. If you are living with depression, there are ways to help you recover from it and also ways to prevent it from coming back. How to cope with lifestyle changes Coping with stress     Stress is your body's reaction to life changes and events, both good and bad. Stressful situations may include:  Getting married.  The death of  a spouse.  Losing a job.  Retiring.  Having a baby. Stress can last just a few hours or it can be ongoing. Stress can play a major role in depression, so it is important to learn both how to cope with stress and how to think about it differently. Talk with your health care provider or a counselor if you would like to learn more about stress reduction. He or she may suggest some stress reduction techniques, such as:  Music therapy. This can include creating music or listening to music. Choose music that you enjoy and that inspires you.  Mindfulness-based meditation. This kind of meditation can be done while sitting or walking. It involves being aware of your normal breaths, rather than trying to control your breathing.  Centering prayer. This is a kind of meditation that involves focusing on a spiritual word or phrase. Choose a word, phrase, or sacred image that is meaningful to you and that brings you peace.  Deep breathing. To do this, expand your stomach and inhale slowly through your nose. Hold your breath for 3-5 seconds, then exhale slowly, allowing your stomach muscles to  relax.  Muscle relaxation. This involves intentionally tensing muscles then relaxing them. Choose a stress reduction technique that fits your lifestyle and personality. Stress reduction techniques take time and practice to develop. Set aside 5-15 minutes a day to do them. Therapists can offer training in these techniques. The training may be covered by some insurance plans. Other things you can do to manage stress include:  Keeping a stress diary. This can help you learn what triggers your stress and ways to control your response.  Understanding what your limits are and saying no to requests or events that lead to a schedule that is too full.  Thinking about how you respond to certain situations. You may not be able to control everything, but you can control how you react.  Adding humor to your life by watching funny films or TV shows.  Making time for activities that help you relax and not feeling guilty about spending your time this way.  Medicines Your health care provider may suggest certain medicines if he or she feels that they will help improve your condition. Avoid using alcohol and other substances that may prevent your medicines from working properly (may interact). It is also important to:  Talk with your pharmacist or health care provider about all the medicines that you take, their possible side effects, and what medicines are safe to take together.  Make it your goal to take part in all treatment decisions (shared decision-making). This includes giving input on the side effects of medicines. It is best if shared decision-making with your health care provider is part of your total treatment plan. If your health care provider prescribes a medicine, you may not notice the full benefits of it for 4-8 weeks. Most people who are treated for depression need to be on medicine for at least 6-12 months after they feel better. If you are taking medicines as part of your treatment, do not stop  taking medicines without first talking to your health care provider. You may need to have the medicine slowly decreased (tapered) over time to decrease the risk of harmful side effects. Relationships Your health care provider may suggest family therapy along with individual therapy and drug therapy. While there may not be family problems that are causing you to feel depressed, it is still important to make sure your family learns as much  as they can about your mental health. Having your family's support can help make your treatment successful. How to recognize changes in your condition Everyone has a different response to treatment for depression. Recovery from major depression happens when you have not had signs of major depression for two months. This may mean that you will start to:  Have more interest in doing activities.  Feel less hopeless than you did 2 months ago.  Have more energy.  Overeat less often, or have better or improving appetite.  Have better concentration. Your health care provider will work with you to decide the next steps in your recovery. It is also important to recognize when your condition is getting worse. Watch for these signs:  Having fatigue or low energy.  Eating too much or too little.  Sleeping too much or too little.  Feeling restless, agitated, or hopeless.  Having trouble concentrating or making decisions.  Having unexplained physical complaints.  Feeling irritable, angry, or aggressive. Get help as soon as you or your family members notice these symptoms coming back. How to get support and help from others How to talk with friends and family members about your condition  Talking to friends and family members about your condition can provide you with one way to get support and guidance. Reach out to trusted friends or family members, explain your symptoms to them, and let them know that you are working with a health care provider to treat your  depression. Financial resources Not all insurance plans cover mental health care, so it is important to check with your insurance carrier. If paying for co-pays or counseling services is a problem, search for a local or county mental health care center. They may be able to offer public mental health care services at low or no cost when you are not able to see a private health care provider. If you are taking medicine for depression, you may be able to get the generic form, which may be less expensive. Some makers of prescription medicines also offer help to patients who cannot afford the medicines they need. Follow these instructions at home:   Get the right amount and quality of sleep.  Cut down on using caffeine, tobacco, alcohol, and other potentially harmful substances.  Try to exercise, such as walking or lifting small weights.  Take over-the-counter and prescription medicines only as told by your health care provider.  Eat a healthy diet that includes plenty of vegetables, fruits, whole grains, low-fat dairy products, and lean protein. Do not eat a lot of foods that are high in solid fats, added sugars, or salt.  Keep all follow-up visits as told by your health care provider. This is important. Contact a health care provider if:  You stop taking your antidepressant medicines, and you have any of these symptoms: ? Nausea. ? Headache. ? Feeling lightheaded. ? Chills and body aches. ? Not being able to sleep (insomnia).  You or your friends and family think your depression is getting worse. Get help right away if:  You have thoughts of hurting yourself or others. If you ever feel like you may hurt yourself or others, or have thoughts about taking your own life, get help right away. You can go to your nearest emergency department or call:  Your local emergency services (911 in the [place]  A suicide crisis helpline, such as the National Suicide Prevention Lifeline at  249-602-8773. This is open 24-hours a day. Summary  If you are living  with depression, there are ways to help you recover from it and also ways to prevent it from coming back.  Work with your health care team to create a management plan that includes counseling, stress management techniques, and healthy lifestyle habits. This information is not intended to replace advice given to you by your health care provider. Make sure you discuss any questions you have with your health care provider. Document Released: 08/29/2016 Document Revised: 01/18/2019 Document Reviewed: 08/29/2016 Elsevier Patient Education  2020 [employer]  Living With Anxiety  After being diagnosed with an anxiety disorder, you may be relieved to know why you have felt or behaved a certain way. It is natural to also feel overwhelmed about the treatment ahead and what it will mean for your life. With care and support, you can manage this condition and recover from it. How to cope with anxiety Dealing with stress Stress is your body's reaction to life changes and events, both good and bad. Stress can last just a few hours or it can be ongoing. Stress can play a major role in anxiety, so it is important to learn both how to cope with stress and how to think about it differently. Talk with your health care provider or a counselor to learn more about stress reduction. He or she may suggest some stress reduction techniques, such as:  Music therapy. This can include creating or listening to music that you enjoy and that inspires you.  Mindfulness-based meditation. This involves being aware of your normal breaths, rather than trying to control your breathing. It can be done while sitting or walking.  Centering prayer. This is a kind of meditation that involves focusing on a word, phrase, or sacred image that is meaningful to you and that brings you peace.  Deep breathing. To do this, expand your stomach and inhale slowly through  your nose. Hold your breath for 3-5 seconds. Then exhale slowly, allowing your stomach muscles to relax.  Self-talk. This is a skill where you identify thought patterns that lead to anxiety reactions and correct those thoughts.  Muscle relaxation. This involves tensing muscles then relaxing them. Choose a stress reduction technique that fits your lifestyle and personality. Stress reduction techniques take time and practice. Set aside 5-15 minutes a day to do them. Therapists can offer training in these techniques. The training may be covered by some insurance plans. Other things you can do to manage stress include:  Keeping a stress diary. This can help you learn what triggers your stress and ways to control your response.  Thinking about how you respond to certain situations. You may not be able to control everything, but you can control your reaction.  Making time for activities that help you relax, and not feeling guilty about spending your time in this way. Therapy combined with coping and stress-reduction skills provides the best chance for successful treatment. Medicines Medicines can help ease symptoms. Medicines for anxiety include:  Anti-anxiety drugs.  Antidepressants.  Beta-blockers. Medicines may be used as the main treatment for anxiety disorder, along with therapy, or if other treatments are not working. Medicines should be prescribed by a health care provider. Relationships Relationships can play a big part in helping you recover. Try to spend more time connecting with trusted friends and family members. Consider going to couples counseling, taking family education classes, or going to family therapy. Therapy can help you and others better understand the condition. How to recognize changes in your condition Everyone has a  different response to treatment for anxiety. Recovery from anxiety happens when symptoms decrease and stop interfering with your daily activities at home or  work. This may mean that you will start to:  Have better concentration and focus.  Sleep better.  Be less irritable.  Have more energy.  Have improved memory. It is important to recognize when your condition is getting worse. Contact your health care provider if your symptoms interfere with home or work and you do not feel like your condition is improving. Where to find help and support: You can get help and support from these sources:  Self-help groups.  Online and [employer].  A trusted spiritual leader.  Couples counseling.  Family education classes.  Family therapy. Follow these instructions at home:  Eat a healthy diet that includes plenty of vegetables, fruits, whole grains, low-fat dairy products, and lean protein. Do not eat a lot of foods that are high in solid fats, added sugars, or salt.  Exercise. Most adults should do the following: ? Exercise for at least 150 minutes each week. The exercise should increase your heart rate and make you sweat (moderate-intensity exercise). ? Strengthening exercises at least twice a week.  Cut down on caffeine, tobacco, alcohol, and other potentially harmful substances.  Get the right amount and quality of sleep. Most adults need 7-9 hours of sleep each night.  Make choices that simplify your life.  Take over-the-counter and prescription medicines only as told by your health care provider.  Avoid caffeine, alcohol, and certain over-the-counter cold medicines. These may make you feel worse. Ask your pharmacist which medicines to avoid.  Keep all follow-up visits as told by your health care provider. This is important. Questions to ask your health care provider  Would I benefit from therapy?  How often should I follow up with a health care provider?  How long do I need to take medicine?  Are there any long-term side effects of my medicine?  Are there any alternatives to taking medicine? Contact a health  care provider if:  You have a hard time staying focused or finishing daily tasks.  You spend many hours a day feeling worried about everyday life.  You become exhausted by worry.  You start to have headaches, feel tense, or have nausea.  You urinate more than normal.  You have diarrhea. Get help right away if:  You have a racing heart and shortness of breath.  You have thoughts of hurting yourself or others. If you ever feel like you may hurt yourself or others, or have thoughts about taking your own life, get help right away. You can go to your nearest emergency department or call:  Your local emergency services (911 in the [place]  A suicide crisis helpline, such as the National Suicide Prevention Lifeline at 415-480-0251. This is open 24-hours a day. Summary  Taking steps to deal with stress can help calm you.  Medicines cannot cure anxiety disorders, but they can help ease symptoms.  Family, friends, and partners can play a big part in helping you recover from an anxiety disorder. This information is not intended to replace advice given to you by your health care provider. Make sure you discuss any questions you have with your health care provider. Document Released: 09/20/2016 Document Revised: 09/08/2017 Document Reviewed: 09/20/2016 Elsevier Patient Education  2020 [employer].
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
## 2019-06-06 DIAGNOSIS — J302 Other seasonal allergic rhinitis: Secondary | ICD-10-CM | POA: Insufficient documentation

## 2019-06-06 DIAGNOSIS — J4540 Moderate persistent asthma, uncomplicated: Secondary | ICD-10-CM | POA: Insufficient documentation

## 2019-06-06 DIAGNOSIS — F32A Depression, unspecified: Secondary | ICD-10-CM | POA: Insufficient documentation

## 2019-06-06 DIAGNOSIS — F329 Major depressive disorder, single episode, unspecified: Secondary | ICD-10-CM | POA: Insufficient documentation

## 2019-06-06 DIAGNOSIS — F17210 Nicotine dependence, cigarettes, uncomplicated: Secondary | ICD-10-CM | POA: Insufficient documentation

## 2019-06-06 DIAGNOSIS — G43909 Migraine, unspecified, not intractable, without status migrainosus: Secondary | ICD-10-CM | POA: Insufficient documentation

## 2019-06-13 ENCOUNTER — Ambulatory Visit (INDEPENDENT_AMBULATORY_CARE_PROVIDER_SITE_OTHER): Payer: 59 | Admitting: Family Medicine

## 2019-06-13 ENCOUNTER — Encounter: Payer: Self-pay | Admitting: Family Medicine

## 2019-06-13 ENCOUNTER — Other Ambulatory Visit: Payer: Self-pay

## 2019-06-13 VITALS — BP 96/70 | HR 70 | Temp 96.5°F | Wt 192.0 lb

## 2019-06-13 DIAGNOSIS — Z23 Encounter for immunization: Secondary | ICD-10-CM

## 2019-06-13 DIAGNOSIS — G43909 Migraine, unspecified, not intractable, without status migrainosus: Secondary | ICD-10-CM | POA: Diagnosis not present

## 2019-06-13 DIAGNOSIS — Z113 Encounter for screening for infections with a predominantly sexual mode of transmission: Secondary | ICD-10-CM | POA: Diagnosis not present

## 2019-06-13 DIAGNOSIS — Z131 Encounter for screening for diabetes mellitus: Secondary | ICD-10-CM | POA: Diagnosis not present

## 2019-06-13 DIAGNOSIS — Z0000 Encounter for general adult medical examination without abnormal findings: Secondary | ICD-10-CM

## 2019-06-13 DIAGNOSIS — Z13220 Encounter for screening for lipoid disorders: Secondary | ICD-10-CM

## 2019-06-13 LAB — LIPID PANEL
Cholesterol: 173 mg/dL (ref 0–200)
HDL: 84.6 mg/dL (ref >39.00)
LDL Cholesterol: 79 mg/dL (ref 0–99)
NonHDL: 88.34
Total CHOL/HDL Ratio: 2
Triglycerides: 46 mg/dL (ref 0.0–149.0)
VLDL: 9.2 mg/dL (ref 0.0–40.0)

## 2019-06-13 LAB — BASIC METABOLIC PANEL
BUN: 11 mg/dL (ref 6–23)
CO2: 28 mEq/L (ref 19–32)
Calcium: 9.1 mg/dL (ref 8.4–10.5)
Chloride: 105 mEq/L (ref 96–112)
Creatinine, Ser: 0.97 mg/dL (ref 0.40–1.20)
GFR: 81.48 mL/min (ref >60.00)
Glucose, Bld: 88 mg/dL (ref 70–99)
Potassium: 4.2 mEq/L (ref 3.5–5.1)
Sodium: 139 mEq/L (ref 135–145)

## 2019-06-13 LAB — CBC WITH DIFFERENTIAL/PLATELET
Basophils Absolute: 0.1 10*3/uL (ref 0.0–0.1)
Basophils Relative: 1.7 % (ref 0.0–3.0)
Eosinophils Absolute: 0.3 10*3/uL (ref 0.0–0.7)
Eosinophils Relative: 6.1 % — ABNORMAL HIGH (ref 0.0–5.0)
HCT: 37.2 % (ref 36.0–46.0)
Hemoglobin: 12.2 g/dL (ref 12.0–15.0)
Lymphocytes Relative: 35.8 % (ref 12.0–46.0)
Lymphs Abs: 1.7 10*3/uL (ref 0.7–4.0)
MCHC: 32.7 g/dL (ref 30.0–36.0)
MCV: 89.5 fl (ref 78.0–100.0)
Monocytes Absolute: 0.4 10*3/uL (ref 0.1–1.0)
Monocytes Relative: 8 % (ref 3.0–12.0)
Neutro Abs: 2.3 10*3/uL (ref 1.4–7.7)
Neutrophils Relative %: 48.4 % (ref 43.0–77.0)
Platelets: 269 10*3/uL (ref 150.0–400.0)
RBC: 4.15 Mil/uL (ref 3.87–5.11)
RDW: 14.1 % (ref 11.5–15.5)
WBC: 4.7 10*3/uL (ref 4.0–10.5)

## 2019-06-13 LAB — TSH: TSH: 0.65 u[IU]/mL (ref 0.35–4.50)

## 2019-06-13 LAB — HEMOGLOBIN A1C: Hgb A1c MFr Bld: 5.8 % (ref 4.6–6.5)

## 2019-06-13 LAB — T4, FREE: Free T4: 0.87 ng/dL (ref 0.60–1.60)

## 2019-06-13 MED ORDER — SUMATRIPTAN SUCCINATE 50 MG PO TABS: 50.0000 mg | ORAL_TABLET | ORAL | 4 refills | Status: AC | PRN

## 2019-06-13 NOTE — Patient Instructions (Signed)
Preventive Care 20-30 Years Old, Female Preventive care refers to visits with your health care provider and lifestyle choices that can promote health and wellness. This includes:  A yearly physical exam. This may also be called an annual well check.  Regular dental visits and eye exams.  Immunizations.  Screening for certain conditions.  Healthy lifestyle choices, such as eating a healthy diet, getting regular exercise, not using drugs or products that contain nicotine and tobacco, and limiting alcohol use. What can I expect for my preventive care visit? Physical exam Your health care provider will check your:  Height and weight. This may be used to calculate body mass index (BMI), which tells if you are at a healthy weight.  Heart rate and blood pressure.  Skin for abnormal spots. Counseling Your health care provider may ask you questions about your:  Alcohol, tobacco, and drug use.  Emotional well-being.  Home and relationship well-being.  Sexual activity.  Eating habits.  Work and work Statistician.  Method of birth control.  Menstrual cycle.  Pregnancy history. What immunizations do I need?  Influenza (flu) vaccine  This is recommended every year. Tetanus, diphtheria, and pertussis (Tdap) vaccine  You may need a Td booster every 10 years. Varicella (chickenpox) vaccine  You may need this if you have not been vaccinated. Human papillomavirus (HPV) vaccine  If recommended by your health care provider, you may need three doses over 6 months. Measles, mumps, and rubella (MMR) vaccine  You may need at least one dose of MMR. You may also need a second dose. Meningococcal conjugate (MenACWY) vaccine  One dose is recommended if you are age 75-21 years and a first-year college student living in a residence hall, or if you have one of several medical conditions. You may also need additional booster doses. Pneumococcal conjugate (PCV13) vaccine  You may need  this if you have certain conditions and were not previously vaccinated. Pneumococcal polysaccharide (PPSV23) vaccine  You may need one or two doses if you smoke cigarettes or if you have certain conditions. Hepatitis A vaccine  You may need this if you have certain conditions or if you travel or work in places where you may be exposed to hepatitis A. Hepatitis B vaccine  You may need this if you have certain conditions or if you travel or work in places where you may be exposed to hepatitis B. Haemophilus influenzae type b (Hib) vaccine  You may need this if you have certain conditions. You may receive vaccines as individual doses or as more than one vaccine together in one shot (combination vaccines). Talk with your health care provider about the risks and benefits of combination vaccines. What tests do I need?  Blood tests  Lipid and cholesterol levels. These may be checked every 5 years starting at age 33.  Hepatitis C test.  Hepatitis B test. Screening  Diabetes screening. This is done by checking your blood sugar (glucose) after you have not eaten for a while (fasting).  Sexually transmitted disease (STD) testing.  BRCA-related cancer screening. This may be done if you have a family history of breast, ovarian, tubal, or peritoneal cancers.  Pelvic exam and Pap test. This may be done every 3 years starting at age 76. Starting at age 102, this may be done every 5 years if you have a Pap test in combination with an HPV test. Talk with your health care provider about your test results, treatment options, and if necessary, the need for more tests.  Follow these instructions at home: Eating and drinking   Eat a diet that includes fresh fruits and vegetables, whole grains, lean protein, and low-fat dairy.  Take vitamin and mineral supplements as recommended by your health care provider.  Do not drink alcohol if: ? Your health care provider tells you not to drink. ? You are  pregnant, may be pregnant, or are planning to become pregnant.  If you drink alcohol: ? Limit how much you have to 0-1 drink a day. ? Be aware of how much alcohol is in your drink. In the [place] one drink equals one 12 oz bottle of beer (355 mL), one 5 oz glass of wine (148 mL), or one 1 oz glass of hard liquor (44 mL). Lifestyle  Take daily care of your teeth and gums.  Stay active. Exercise for at least 30 minutes on 5 or more days each week.  Do not use any products that contain nicotine or tobacco, such as cigarettes, e-cigarettes, and chewing tobacco. If you need help quitting, ask your health care provider.  If you are sexually active, practice safe sex. Use a condom or other form of birth control (contraception) in order to prevent pregnancy and STIs (sexually transmitted infections). If you plan to become pregnant, see your health care provider for a preconception visit. What's next?  Visit your health care provider once a year for a well check visit.  Ask your health care provider how often you should have your eyes and teeth checked.  Stay up to date on all vaccines. This information is not intended to replace advice given to you by your health care provider. Make sure you discuss any questions you have with your health care provider. Document Released: 11/22/2001 Document Revised: 06/07/2018 Document Reviewed: 06/07/2018 Elsevier Patient Education  [place].  Migraine Headache A migraine headache is an intense, throbbing pain on one side or both sides of the head. Migraine headaches may also cause other symptoms, such as nausea, vomiting, and sensitivity to light and noise. A migraine headache can last from 4 hours to 3 days. Talk with your doctor about what things may bring on (trigger) your migraine headaches. What are the causes? The exact cause of this condition is not known. However, a migraine may be caused when nerves in the brain become irritated and release  chemicals that cause inflammation of blood vessels. This inflammation causes pain. This condition may be triggered or caused by:  Drinking alcohol.  Smoking.  Taking medicines, such as: ? Medicine used to treat chest pain (nitroglycerin). ? Birth control pills. ? Estrogen. ? Certain blood pressure medicines.  Eating or drinking products that contain nitrates, glutamate, aspartame, or tyramine. Aged cheeses, chocolate, or caffeine may also be triggers.  Doing physical activity. Other things that may trigger a migraine headache include:  Menstruation.  Pregnancy.  Hunger.  Stress.  Lack of sleep or too much sleep.  Weather changes.  Fatigue. What increases the risk? The following factors may make you more likely to experience migraine headaches:  Being a certain age. This condition is more common in people who are 48-2 years old.  Being female.  Having a family history of migraine headaches.  Being Caucasian.  Having a mental health condition, such as depression or anxiety.  Being obese. What are the signs or symptoms? The main symptom of this condition is pulsating or throbbing pain. This pain may:  Happen in any area of the head, such as on one side or  both sides.  Interfere with daily activities.  Get worse with physical activity.  Get worse with exposure to bright lights or loud noises. Other symptoms may include:  Nausea.  Vomiting.  Dizziness.  General sensitivity to bright lights, loud noises, or smells. Before you get a migraine headache, you may get warning signs (an aura). An aura may include:  Seeing flashing lights or having blind spots.  Seeing bright spots, halos, or zigzag lines.  Having tunnel vision or blurred vision.  Having numbness or a tingling feeling.  Having trouble talking.  Having muscle weakness. Some people have symptoms after a migraine headache (postdromal phase), such as:  Feeling tired.  Difficulty  concentrating. How is this diagnosed? A migraine headache can be diagnosed based on:  Your symptoms.  A physical exam.  Tests, such as: ? CT scan or an MRI of the head. These imaging tests can help rule out other causes of headaches. ? Taking fluid from the spine (lumbar puncture) and analyzing it (cerebrospinal fluid analysis, or CSF analysis). How is this treated? This condition may be treated with medicines that:  Relieve pain.  Relieve nausea.  Prevent migraine headaches. Treatment for this condition may also include:  Acupuncture.  Lifestyle changes like avoiding foods that trigger migraine headaches.  Biofeedback.  Cognitive behavioral therapy. Follow these instructions at home: Medicines  Take over-the-counter and prescription medicines only as told by your health care provider.  Ask your health care provider if the medicine prescribed to you: ? Requires you to avoid driving or using heavy machinery. ? Can cause constipation. You may need to take these actions to prevent or treat constipation:  Drink enough fluid to keep your urine pale yellow.  Take over-the-counter or prescription medicines.  Eat foods that are high in fiber, such as beans, whole grains, and fresh fruits and vegetables.  Limit foods that are high in fat and processed sugars, such as fried or sweet foods. Lifestyle  Do not drink alcohol.  Do not use any products that contain nicotine or tobacco, such as cigarettes, e-cigarettes, and chewing tobacco. If you need help quitting, ask your health care provider.  Get at least 8 hours of sleep every night.  Find ways to manage stress, such as meditation, deep breathing, or yoga. General instructions      Keep a journal to find out what may trigger your migraine headaches. For example, write down: ? What you eat and drink. ? How much sleep you get. ? Any change to your diet or medicines.  If you have a migraine headache: ? Avoid things  that make your symptoms worse, such as bright lights. ? It may help to lie down in a dark, quiet room. ? Do not drive or use heavy machinery. ? Ask your health care provider what activities are safe for you while you are experiencing symptoms.  Keep all follow-up visits as told by your health care provider. This is important. Contact a health care provider if:  You develop symptoms that are different or more severe than your usual migraine headache symptoms.  You have more than 15 headache days in one month. Get help right away if:  Your migraine headache becomes severe.  Your migraine headache lasts longer than 72 hours.  You have a fever.  You have a stiff neck.  You have vision loss.  Your muscles feel weak or like you cannot control them.  You start to lose your balance often.  You have trouble walking.  You faint.  You have a seizure. Summary  A migraine headache is an intense, throbbing pain on one side or both sides of the head. Migraines may also cause other symptoms, such as nausea, vomiting, and sensitivity to light and noise.  This condition may be treated with medicines and lifestyle changes. You may also need to avoid certain things that trigger a migraine headache.  Keep a journal to find out what may trigger your migraine headaches.  Contact your health care provider if you have more than 15 headache days in a month or you develop symptoms that are different or more severe than your usual migraine headache symptoms. This information is not intended to replace advice given to you by your health care provider. Make sure you discuss any questions you have with your health care provider. Document Released: 09/26/2005 Document Revised: 01/18/2019 Document Reviewed: 11/08/2018 Elsevier Patient Education  2020 [employer].

## 2019-06-13 NOTE — Progress Notes (Signed)
Subjective:     Brandi Ball is a 28 y.o. female and is here for a comprehensive physical exam. The patient reports problems - migraines.  Recent HA lasted 4 days.  Pt tried aleeve and ibuprofen without relief.  Pt found medication she was previously on for HAs, sumatriptan 50 mg. Mood is ok.  Last pap 10/2018.  Social History   Socioeconomic History  . Marital status: Single    Spouse name: Not on file  . Number of children: Not on file  . Years of education: Not on file  . Highest education level: Not on file  Occupational History  . Not on file  Social Needs  . Financial resource strain: Not on file  . Food insecurity    Worry: Not on file    Inability: Not on file  . Transportation needs    Medical: Not on file    Non-medical: Not on file  Tobacco Use  . Smoking status: Current Every Day Smoker    Packs/day: 0.50    Types: Cigarettes  . Smokeless tobacco: Never Used  Substance and Sexual Activity  . Alcohol use: Yes    Comment: wine  . Drug use: No  . Sexual activity: Yes    Birth control/protection: None  Lifestyle  . Physical activity    Days per week: Not on file    Minutes per session: Not on file  . Stress: Not on file  Relationships  . Social Musicianconnections    Talks on phone: Not on file    Gets together: Not on file    Attends religious service: Not on file    Active member of club or organization: Not on file    Attends meetings of clubs or organizations: Not on file    Relationship status: Not on file  . Intimate partner violence    Fear of current or ex partner: Not on file    Emotionally abused: Not on file    Physically abused: Not on file    Forced sexual activity: Not on file  Other Topics Concern  . Not on file  Social History Narrative  . Not on file   Health Maintenance  Topic Date Due  . TETANUS/TDAP  04/03/2008  . PAP SMEAR-Modifier  04/03/2010  . INFLUENZA VACCINE  05/11/2019  . HIV Screening  Completed    The following  portions of the patient's history were reviewed and updated as appropriate: allergies, current medications, past family history, past medical history, past social history, past surgical history and problem list.  Review of Systems Pertinent items noted in HPI and remainder of comprehensive ROS otherwise negative.   Objective:    BP 96/70 (BP Location: Left Arm, Patient Position: Sitting, Cuff Size: Large)   Pulse 70   Temp (!) 96.5 F (35.8 C) (Temporal)   Wt 192 lb (87.1 kg)   LMP 06/04/2019 (Exact Date)   SpO2 98%   BMI 34.01 kg/m  General appearance: alert, cooperative and no distress Head: Normocephalic, without obvious abnormality, atraumatic Eyes: conjunctivae/corneas clear. PERRL, EOM's intact. Fundi benign. Ears: normal TM's and external ear canals both ears Nose: Nares normal. Septum midline. Mucosa normal. No drainage or sinus tenderness. Throat: lips, mucosa, and tongue normal; teeth and gums normal Neck: no adenopathy, no carotid bruit, no JVD, supple, symmetrical, trachea midline and thyroid not enlarged, symmetric, no tenderness/mass/nodules Lungs: clear to auscultation bilaterally Heart: regular rate and rhythm, S1, S2 normal, no murmur, click, rub or gallop Abdomen: soft,  non-tender; bowel sounds normal; no masses,  no organomegaly Extremities: extremities normal, atraumatic, no cyanosis or edema Pulses: 2+ and symmetric Skin: Skin color, texture, turgor normal. No rashes or lesions Lymph nodes: Cervical, supraclavicular, and axillary nodes normal. Neurologic: Alert and oriented X 3, normal strength and tone. Normal symmetric reflexes. Normal coordination and gait    Assessment:    Healthy female exam with h/o migraines.     Plan:     Anticipatory guidance given including wearing seatbelts, smoke detectors in the home, increasing physical activity, increasing p.o. intake of water and vegetables. -will obtain labs -pap up to date done 10/2018 -given  handout -next CPE in 1 yr See After Visit Summary for Counseling Recommendations    Migraine without status migrainosus, not intractable, unspecified migraine type  - Plan: SUMAtriptan (IMITREX) 50 MG tablet, CBC with Differential/Platelet, Basic metabolic panel, TSH, T4, Free  Routine screening for STI (sexually transmitted infection)  - Plan: RPR, HIV antibody (with reflex), C. trachomatis/N. gonorrhoeae RNA  Screening for lipid disorders  - Plan: Lipid panel  Screening for diabetes mellitus  - Plan: Hemoglobin A1c  Need for influenza vaccination -given today  F/u in 4-6 wks  Grier Mitts, MD

## 2019-06-13 NOTE — Addendum Note (Signed)
Addended by: Wyvonne Lenz on: 06/13/2019 01:17 PM   Modules accepted: Orders

## 2019-06-14 LAB — HIV ANTIBODY (ROUTINE TESTING W REFLEX): HIV 1&2 Ab, 4th Generation: NONREACTIVE

## 2019-06-14 LAB — C. TRACHOMATIS/N. GONORRHOEAE RNA
C. trachomatis RNA, TMA: NOT DETECTED
N. gonorrhoeae RNA, TMA: NOT DETECTED

## 2019-06-14 LAB — RPR: RPR Ser Ql: NONREACTIVE

## 2019-07-15 ENCOUNTER — Telehealth: Payer: 59 | Admitting: Family Medicine

## 2019-07-15 ENCOUNTER — Other Ambulatory Visit: Payer: Self-pay

## 2020-10-23 ENCOUNTER — Encounter (HOSPITAL_COMMUNITY): Payer: Self-pay | Admitting: *Deleted

## 2020-10-23 ENCOUNTER — Other Ambulatory Visit: Payer: Self-pay

## 2020-10-23 ENCOUNTER — Emergency Department (HOSPITAL_COMMUNITY)
Admission: EM | Admit: 2020-10-23 | Discharge: 2020-10-23 | Disposition: A | Discharge status: Home / Self Care | Payer: HRSA Program | Attending: Emergency Medicine | Admitting: Emergency Medicine

## 2020-10-23 DIAGNOSIS — U071 COVID-19: Secondary | ICD-10-CM | POA: Diagnosis not present

## 2020-10-23 DIAGNOSIS — Z7982 Long term (current) use of aspirin: Secondary | ICD-10-CM | POA: Insufficient documentation

## 2020-10-23 DIAGNOSIS — J45909 Unspecified asthma, uncomplicated: Secondary | ICD-10-CM | POA: Diagnosis not present

## 2020-10-23 DIAGNOSIS — F17210 Nicotine dependence, cigarettes, uncomplicated: Secondary | ICD-10-CM | POA: Insufficient documentation

## 2020-10-23 DIAGNOSIS — Z91040 Latex allergy status: Secondary | ICD-10-CM | POA: Insufficient documentation

## 2020-10-23 DIAGNOSIS — B349 Viral infection, unspecified: Secondary | ICD-10-CM

## 2020-10-23 DIAGNOSIS — M791 Myalgia, unspecified site: Secondary | ICD-10-CM | POA: Diagnosis present

## 2020-10-23 LAB — SARS CORONAVIRUS 2 (TAT 6-24 HRS): SARS Coronavirus 2: POSITIVE — AB

## 2020-10-23 MED ORDER — ALBUTEROL SULFATE HFA 108 (90 BASE) MCG/ACT IN AERS
2.0000 | INHALATION_SPRAY | Freq: Once | RESPIRATORY_TRACT | Status: AC
  Administered 2020-10-23: 2 via RESPIRATORY_TRACT
  Filled 2020-10-23: qty 6.7

## 2020-10-23 MED ORDER — AEROCHAMBER Z-STAT PLUS/MEDIUM MISC
1.0000 | Freq: Once | Status: AC
  Administered 2020-10-23: 1
  Filled 2020-10-23: qty 1

## 2020-10-23 MED ORDER — ONDANSETRON HCL 4 MG PO TABS: 4.0000 mg | ORAL_TABLET | Freq: Three times a day (TID) | ORAL | 0 refills | Status: AC | PRN

## 2020-10-23 NOTE — Discharge Instructions (Addendum)
You were seen in the emergency department today for your symptoms, fever, body aches, headache, nausea vomiting diarrhea.  Physical exam and vital signs are very reassuring.  You have been tested for COVID-19.  You may follow-up with your test results in the MyChart app.  Continue to take over-the-counter medication as needed for your symptoms.  You may take Imodium as needed for your diarrhea.  You have been prescribed a medication called Zofran, which is a nausea medication that you may take prior to eating to help you keep down your food.  Encourage you to prioritize rest and plenty of hydration over the next few days  Return to the emergency department develop any worsening nausea, vomiting does not stop, any chest pain, shortness of breath, or any other severe symptoms.

## 2020-10-23 NOTE — ED Triage Notes (Signed)
Presents with body aches, headache, congestion, N/V/D

## 2020-10-23 NOTE — ED Provider Notes (Signed)
[facility]-EMERGENCY DEPT Provider Note   CSN: [id number] Arrival date & time: 10/23/20  1031     History Chief Complaint  Patient presents with  . Headache  . Emesis  . Diarrhea  . Generalized Body Aches    Brandi Ball is a 29 y.o. female who presents with concern for body aches, headache, congestion, sore throat, nausea, vomiting, diarrhea, fever with T-max of 102 F at home.  Symptoms began on 10/15/2020.  Patient has been vaccinated times 2 + booster vaccine against COVID-19.  Denies known sick contacts.  She endorses some wheezing at home, secondary to underlying asthma; she has not been able to receive her asthma medication as she is currently without medical insurance.  She endorses NBNB emesis, only after she eats, endorses loose stools after she eats occasionally, throughout the day.  Endorses increased hydration at home, has been able to drink plenty of fluids.  Denies any chest pain, difficulty breathing, palpitations.  She does endorse mild intermittent cough.  She has been utilizing over-the-counter medications such as Tylenol at home for her symptoms.  Personally reviewed the patient's medical record.  She has history of asthma and migraines.  HPI     Past Medical History:  Diagnosis Date  . Asthma    childhood  . Bipolar 1 disorder (HCC)   . Depression     Patient Active Problem List   Diagnosis Date Noted  . Migraine 06/06/2019  . Seasonal allergies 06/06/2019  . Cigarette nicotine dependence without complication 06/06/2019  . Anxiety and depression 06/06/2019  . Moderate persistent asthma without complication 06/06/2019  . Irregular menstrual cycle 04/27/2015  . Cough 06/06/2014  . URI (upper respiratory infection) 06/06/2014    Past Surgical History:  Procedure Laterality Date  . ABDOMINAL SURGERY       OB History    Gravida  1   Para  0   Term  0   Preterm  0   AB  1   Living  0     SAB  1   IAB  0    Ectopic  0   Multiple  0   Live Births              No family history on file.  Social History   Tobacco Use  . Smoking status: Current Every Day Smoker    Packs/day: 0.50    Types: Cigarettes  . Smokeless tobacco: Never Used  Substance Use Topics  . Alcohol use: Yes    Comment: wine  . Drug use: No    Home Medications Prior to Admission medications   Medication Sig Start Date End Date Taking? Authorizing Provider  albuterol (PROVENTIL HFA;VENTOLIN HFA) 108 (90 Base) MCG/ACT inhaler Inhale 2 puffs into the lungs every 4 (four) hours as needed for wheezing or shortness of breath. 06/23/16   Joy, Hillard Danker, PA-C  aspirin-acetaminophen-caffeine (EXCEDRIN MIGRAINE) [phone] MG tablet Take 2 tablets by mouth every 6 (six) hours as needed for headache.    [provider]  beclomethasone (QVAR) 40 MCG/ACT inhaler Inhale 2 puffs into the lungs 2 (two) times daily. 06/05/19   Deeann Saint, MD  cyclobenzaprine (FLEXERIL) 10 MG tablet Take 1 tablet (10 mg total) by mouth 2 (two) times daily as needed for muscle spasms. 09/16/16   Hedges, Tinnie Gens, PA-C  guaiFENesin (MUCINEX) 600 MG 12 hr tablet Take 1 tablet (600 mg total) by mouth 2 (two) times daily. 11/18/18   Couture, Cortni  S, PA-C  lidocaine (LIDODERM) 5 % Place 1 patch onto the skin daily. Remove & Discard patch within 12 hours or as directed by MD Patient taking differently: Place 1 patch onto the skin as needed. Remove & Discard patch within 12 hours or as directed by MD 09/23/16   Joy, Shawn C, PA-C  loratadine (CLARITIN) 10 MG tablet Take 1 tablet (10 mg total) by mouth daily. 06/05/19   Deeann SaintBanks, Shannon R, MD  methocarbamol (ROBAXIN) 500 MG tablet Take 1 tablet (500 mg total) by mouth 2 (two) times daily. 09/23/16   Joy, Shawn C, PA-C  ondansetron (ZOFRAN) 4 MG tablet Take 1 tablet (4 mg total) by mouth every 8 (eight) hours as needed for nausea or vomiting. 11/18/18   Couture, Cortni S, PA-C  phenazopyridine (PYRIDIUM) 200  MG tablet Take 1 tablet (200 mg total) by mouth 3 (three) times daily. 08/01/16   Rasch, Victorino DikeJennifer I, NP  SUMAtriptan (IMITREX) 50 MG tablet Take 1 tablet (50 mg total) by mouth every 2 (two) hours as needed for migraine. May repeat in 2 hours if headache persists or recurs. 06/13/19   Deeann SaintBanks, Shannon R, MD  triamcinolone cream (KENALOG) 0.5 % Apply 1 application topically 3 (three) times daily. 08/09/17   Aviva SignsWilliams, Marie L, CNM    Allergies    Penicillins and Latex  Review of Systems   Review of Systems  Constitutional: Positive for activity change, appetite change, chills, fatigue and fever.  HENT: Positive for congestion and sore throat. Negative for trouble swallowing and voice change.   Eyes: Negative.  Negative for visual disturbance.  Respiratory: Positive for cough. Negative for chest tightness, shortness of breath, wheezing and stridor.   Cardiovascular: Negative for chest pain, palpitations and leg swelling.  Gastrointestinal: Positive for abdominal pain, diarrhea, nausea and vomiting.  Genitourinary: Negative for difficulty urinating, dysuria, hematuria and urgency.  Musculoskeletal: Positive for myalgias.  Skin: Negative.   Allergic/Immunologic: Negative.   Neurological: Positive for headaches. Negative for syncope and weakness.  Hematological: Negative.     Physical Exam Updated Vital Signs BP (!) 141/86 (BP Location: Right Arm)   Pulse 80   Temp 98.9 F (37.2 C) (Oral)   Resp 17   Ht 5\' 3"  (1.6 m)   Wt 91.6 kg   SpO2 100%   BMI 35.78 kg/m   Physical Exam Vitals and nursing note reviewed.  Constitutional:      Appearance: She is obese.  HENT:     Head: Normocephalic and atraumatic.     Nose: Nose normal.     Mouth/Throat:     Mouth: Mucous membranes are moist.     Pharynx: Oropharynx is clear. Uvula midline. Posterior oropharyngeal erythema present. No pharyngeal swelling or oropharyngeal exudate.     Tonsils: No tonsillar exudate.  Eyes:     General: Lids  are normal. Vision grossly intact.        Right eye: No discharge.        Left eye: No discharge.     Extraocular Movements: Extraocular movements intact.     Conjunctiva/sclera: Conjunctivae normal.     Pupils: Pupils are equal, round, and reactive to light.  Neck:     Trachea: Trachea and phonation normal.  Cardiovascular:     Rate and Rhythm: Normal rate and regular rhythm.     Pulses: Normal pulses.          Radial pulses are 2+ on the right side and 2+ on the left side.  Dorsalis pedis pulses are 2+ on the right side and 2+ on the left side.     Heart sounds: Normal heart sounds. No murmur heard.   Pulmonary:     Effort: Pulmonary effort is normal. No accessory muscle usage, respiratory distress or retractions.     Breath sounds: Examination of the right-lower field reveals wheezing. Examination of the left-lower field reveals wheezing. Wheezing present. No rales.  Chest:     Chest wall: No lacerations, deformity, swelling, tenderness or crepitus.  Abdominal:     General: Bowel sounds are normal. There is no distension.     Palpations: Abdomen is soft.     Tenderness: There is no abdominal tenderness. There is no guarding or rebound.  Musculoskeletal:        General: No swelling or deformity. Normal range of motion.     Cervical back: Normal range of motion and neck supple. No signs of trauma, rigidity, torticollis or crepitus. No pain with movement, spinous process tenderness or muscular tenderness.     Right lower leg: No edema.     Left lower leg: No edema.  Lymphadenopathy:     Cervical: No cervical adenopathy.  Skin:    General: Skin is warm and dry.     Capillary Refill: Capillary refill takes less than 2 seconds.  Neurological:     Mental Status: She is alert and oriented to person, place, and time.     Sensory: Sensation is intact.     Motor: Motor function is intact.     Gait: Gait is intact.  Psychiatric:        Mood and Affect: Mood normal.     ED  Results / Procedures / Treatments   Labs (all labs ordered are listed, but only abnormal results are displayed) Labs Reviewed  SARS CORONAVIRUS 2 (TAT 6-24 HRS)    EKG None  Radiology No results found.  Procedures Procedures (including critical care time)  Medications Ordered in ED Medications  albuterol (VENTOLIN HFA) 108 (90 Base) MCG/ACT inhaler 2 puff (2 puffs Inhalation Given 10/23/20 1217)  aerochamber Z-Stat Plus/medium 1 each (1 each Other Given 10/23/20 1217)    ED Course  I have reviewed the triage vital signs and the nursing notes.  Pertinent labs & imaging results that were available during my care of the patient were reviewed by me and considered in my medical decision making (see chart for details).    MDM Rules/Calculators/A&P                         30 year old female presents with concern for 9 days of fevers, body aches, headache, congestion, nausea vomiting and diarrhea.  Patient is vaccinated x3 against COVID-19.  Denies known sick contacts.  Vital signs and intake are very reassuring.  Patient is mildly hypertensive to 141/86, otherwise vital signs are normal.  Physical exam also is very reassuring.  Cardiopulmonary exam revealed mild wheezing the lung bases (history of asthma in this patient without current asthma medications), but is otherwise normal.  Abdominal exam is benign.  There are some mild posterior pharyngeal erythema without swelling or exudate. Albuterol ordered.   On repeat auscultation, patient's lungs are CTAB.   Given reassuring physical exam and vital signs in this well-appearing patient, do not feel any further imaging studies or laboratory studies are warranted.  No further work-up necessary in the ED at this time.  Patient's been tested for COVID-19, she may follow-up on her  results in the MyChart app.  Brandi Ball voiced understanding of her medical evaluation and treatment plan.  Her questions were answered to her expressed satisfaction.   Return precautions were given.  COVID-19 isolation precautions are given.  Patient is well-appearing, stable, and appropriate for discharge at this time. . Brandi Ball was evaluated in Emergency Department on 10/23/2020 for the symptoms described in the history of present illness. She was evaluated in the context of the global COVID-19 pandemic, which necessitated consideration that the patient might be at risk for infection with the SARS-CoV-2 virus that causes COVID-19. Institutional protocols and algorithms that pertain to the evaluation of patients at risk for COVID-19 are in a state of rapid change based on information released by regulatory bodies including the CDC and federal and state organizations. These policies and algorithms were followed during the patient's care in the ED.  This chart was dictated using voice recognition software, Dragon. Despite the best efforts of this provider to proofread and correct errors, errors may still occur which can change documentation meaning.   Final Clinical Impression(s) / ED Diagnoses Final diagnoses:  None    Rx / DC Orders ED Discharge Orders    None       Sherrilee Gilles 10/23/20 1227    Vanetta Mulders, MD 10/29/20 1754

## 2021-03-25 ENCOUNTER — Other Ambulatory Visit: Payer: Self-pay

## 2021-03-25 ENCOUNTER — Encounter: Payer: Self-pay | Admitting: Family Medicine

## 2021-03-25 ENCOUNTER — Ambulatory Visit (INDEPENDENT_AMBULATORY_CARE_PROVIDER_SITE_OTHER): Payer: BC Managed Care – PPO | Admitting: Family Medicine

## 2021-03-25 VITALS — BP 118/78 | HR 70 | Temp 98.3°F | Ht 64.0 in | Wt 215.2 lb

## 2021-03-25 DIAGNOSIS — G4700 Insomnia, unspecified: Secondary | ICD-10-CM | POA: Diagnosis not present

## 2021-03-25 DIAGNOSIS — F17210 Nicotine dependence, cigarettes, uncomplicated: Secondary | ICD-10-CM | POA: Diagnosis not present

## 2021-03-25 DIAGNOSIS — F32A Depression, unspecified: Secondary | ICD-10-CM

## 2021-03-25 DIAGNOSIS — Z0000 Encounter for general adult medical examination without abnormal findings: Secondary | ICD-10-CM

## 2021-03-25 DIAGNOSIS — F419 Anxiety disorder, unspecified: Secondary | ICD-10-CM | POA: Diagnosis not present

## 2021-03-25 DIAGNOSIS — Z113 Encounter for screening for infections with a predominantly sexual mode of transmission: Secondary | ICD-10-CM | POA: Diagnosis not present

## 2021-03-25 DIAGNOSIS — Z6836 Body mass index (BMI) 36.0-36.9, adult: Secondary | ICD-10-CM

## 2021-03-25 LAB — CBC WITH DIFFERENTIAL/PLATELET
Basophils Absolute: 0.1 10*3/uL (ref 0.0–0.1)
Basophils Relative: 1.7 % (ref 0.0–3.0)
Eosinophils Absolute: 0.3 10*3/uL (ref 0.0–0.7)
Eosinophils Relative: 4.1 % (ref 0.0–5.0)
HCT: 36.5 % (ref 36.0–46.0)
Hemoglobin: 12.1 g/dL (ref 12.0–15.0)
Lymphocytes Relative: 35.5 % (ref 12.0–46.0)
Lymphs Abs: 2.9 10*3/uL (ref 0.7–4.0)
MCHC: 33.2 g/dL (ref 30.0–36.0)
MCV: 88.5 fl (ref 78.0–100.0)
Monocytes Absolute: 0.5 10*3/uL (ref 0.1–1.0)
Monocytes Relative: 6.6 % (ref 3.0–12.0)
Neutro Abs: 4.2 10*3/uL (ref 1.4–7.7)
Neutrophils Relative %: 52.1 % (ref 43.0–77.0)
Platelets: 244 10*3/uL (ref 150.0–400.0)
RBC: 4.13 Mil/uL (ref 3.87–5.11)
RDW: 14.3 % (ref 11.5–15.5)
WBC: 8.1 10*3/uL (ref 4.0–10.5)

## 2021-03-25 LAB — LIPID PANEL
Cholesterol: 168 mg/dL (ref 0–200)
HDL: 67.2 mg/dL (ref >39.00)
LDL Cholesterol: 89 mg/dL (ref 0–99)
NonHDL: 100.63
Total CHOL/HDL Ratio: 2
Triglycerides: 57 mg/dL (ref 0.0–149.0)
VLDL: 11.4 mg/dL (ref 0.0–40.0)

## 2021-03-25 LAB — BASIC METABOLIC PANEL
BUN: 12 mg/dL (ref 6–23)
CO2: 28 mEq/L (ref 19–32)
Calcium: 8.8 mg/dL (ref 8.4–10.5)
Chloride: 105 mEq/L (ref 96–112)
Creatinine, Ser: 0.93 mg/dL (ref 0.40–1.20)
GFR: 81.63 mL/min (ref >60.00)
Glucose, Bld: 77 mg/dL (ref 70–99)
Potassium: 4 mEq/L (ref 3.5–5.1)
Sodium: 139 mEq/L (ref 135–145)

## 2021-03-25 LAB — HEMOGLOBIN A1C: Hgb A1c MFr Bld: 5.7 % (ref 4.6–6.5)

## 2021-03-25 LAB — T4, FREE: Free T4: 0.87 ng/dL (ref 0.60–1.60)

## 2021-03-25 LAB — TSH: TSH: 1.81 u[IU]/mL (ref 0.35–4.50)

## 2021-03-25 NOTE — Progress Notes (Signed)
Subjective:     Brandi Ball is a 30 y.o. female and is here for a comprehensive physical exam. The patient reports problems - as listed below .  Patient working to 2 jobs and in school.  States having difficulty falling asleep when gets off of second job.  Mainly in bed but finds it difficult to relax.  Was previously in counseling at [facility], but counselor moved.  Has considered restarting.  Patient also notes anxiety with driving.  Typically will have someone drop her off if she has to go places.  Still smoking cigarettes.  Social History   Socioeconomic History   Marital status: Single    Spouse name: Not on file   Number of children: Not on file   Years of education: Not on file   Highest education level: Not on file  Occupational History   Not on file  Tobacco Use   Smoking status: Every Day    Packs/day: 0.50    Pack years: 0.00    Types: Cigarettes   Smokeless tobacco: Never  Substance and Sexual Activity   Alcohol use: Yes    Comment: wine   Drug use: No   Sexual activity: Yes    Birth control/protection: None  Other Topics Concern   Not on file  Social History Narrative   Not on file   Social Determinants of Health   Financial Resource Strain: Not on file  Food Insecurity: Not on file  Transportation Needs: Not on file  Physical Activity: Not on file  Stress: Not on file  Social Connections: Not on file  Intimate Partner Violence: Not on file   Health Maintenance  Topic Date Due   COVID-19 Vaccine (1) Never done   Pneumococcal Vaccine 72-44 Years old (1 - PCV) Never done   Hepatitis C Screening  Never done   TETANUS/TDAP  Never done   PAP SMEAR-Modifier  Never done   INFLUENZA VACCINE  05/10/2021   Zoster Vaccines- Shingrix (1 of 2) 04/04/2039   HIV Screening  Completed   HPV VACCINES  Aged Out    The following portions of the patient's history were reviewed and updated as appropriate: allergies, current medications, past family history,  past medical history, past social history, past surgical history, and problem list.  Review of Systems Pertinent items noted in HPI and remainder of comprehensive ROS otherwise negative.   Objective:    BP 118/78 (BP Location: Right Arm, Patient Position: Sitting, Cuff Size: Large)   Pulse 70   Temp 98.3 F (36.8 C) (Oral)   Ht 5\' 4"  (1.626 m)   Wt 215 lb 3.2 oz (97.6 kg)   LMP 03/15/2021 (Approximate)   SpO2 99%   BMI 36.94 kg/m  General appearance: alert, cooperative, and no distress Head: Normocephalic, without obvious abnormality, atraumatic Eyes: conjunctivae/corneas clear. PERRL, EOM's intact. Fundi benign. Ears: normal TM's and external ear canals both ears Nose: Nares normal. Septum midline. Mucosa normal. No drainage or sinus tenderness. Throat: lips, mucosa, and tongue normal; teeth and gums normal Neck: no adenopathy, no carotid bruit, no JVD, supple, symmetrical, trachea midline, and thyroid not enlarged, symmetric, no tenderness/mass/nodules Back: symmetric, no curvature. ROM normal. No CVA tenderness. Lungs: clear to auscultation bilaterally Heart: regular rate and rhythm, S1, S2 normal, no murmur, click, rub or gallop Abdomen: soft, non-tender; bowel sounds normal; no masses,  no organomegaly Extremities: extremities normal, atraumatic, no cyanosis or edema Pulses: 2+ and symmetric Skin: Skin color, texture, turgor normal. No rashes  or lesions Lymph nodes: Cervical, supraclavicular, and axillary nodes normal. Neurologic: Alert and oriented X 3, normal strength and tone. Normal symmetric reflexes. Normal coordination and gait    Assessment:    Healthy female exam.      Plan:    Anticipatory guidance given including wearing seatbelts, smoke detectors in the home, increasing physical activity, increasing p.o. intake of water and vegetables. -will obtain labs -mammogram not due 2/2 age -given handout -next CPE in 1 yr See After Visit Summary for Counseling  Recommendations   Anxiety and depression -PHQ 9 score 12 -GAD 7 score 9 -discussed counseling and medication options  - Plan: TSH, T4, Free  Insomnia, unspecified type  -Sleep hygiene - Plan: TSH, T4, Free  Routine screening for STI (sexually transmitted infection)  - Plan: RPR, HIV Antibody (routine testing w rflx), C. trachomatis/N. gonorrhoeae RNA  Cigarette nicotine dependence without complication -Smoking cessation greater than 3 minutes, less than 10 minutes -Patient encouraged to quit -Consider cutting down -We will continue to monitor -Given handout  Class 2 severe obesity due to excess calories with serious comorbidity and body mass index (BMI) of 36.0 to 36.9 in adult [facility]) -Discussed lifestyle modifications including increasing physical activity -Continue to monitor  F/u in 1 month  Abbe Amsterdam, MD

## 2021-03-26 LAB — C. TRACHOMATIS/N. GONORRHOEAE RNA
C. trachomatis RNA, TMA: NOT DETECTED
N. gonorrhoeae RNA, TMA: NOT DETECTED

## 2021-03-26 LAB — RPR: RPR Ser Ql: NONREACTIVE

## 2021-03-26 LAB — HIV ANTIBODY (ROUTINE TESTING W REFLEX): HIV 1&2 Ab, 4th Generation: NONREACTIVE

## 2021-04-28 ENCOUNTER — Other Ambulatory Visit: Payer: Self-pay

## 2021-04-28 ENCOUNTER — Ambulatory Visit (INDEPENDENT_AMBULATORY_CARE_PROVIDER_SITE_OTHER): Payer: BC Managed Care – PPO | Admitting: Family Medicine

## 2021-04-28 ENCOUNTER — Encounter: Payer: Self-pay | Admitting: Family Medicine

## 2021-04-28 VITALS — BP 118/70 | HR 71 | Temp 98.7°F | Wt 214.2 lb

## 2021-04-28 DIAGNOSIS — S93601A Unspecified sprain of right foot, initial encounter: Secondary | ICD-10-CM | POA: Diagnosis not present

## 2021-04-28 DIAGNOSIS — M79671 Pain in right foot: Secondary | ICD-10-CM | POA: Diagnosis not present

## 2021-04-28 NOTE — Progress Notes (Signed)
Subjective:    Patient ID: Brandi Ball, female    DOB: 09-30-1991, 30 y.o.   MRN: [id number]  Chief Complaint  Patient presents with   Follow-up   Foot Injury    R foot pain after stepping on curb    HPI Patient was seen today for acute concern.  Patient endorses right lateral foot pain after foot slid off a curb while stepping up 2 weeks ago.  Patient denies immediate pain, edema, erythema, bruising.  A few days after the incident lateral started hurting.  Pain is intermittent, dull at times been more intense, "feels like a fracture".  Patient has a history of left foot fracture.  Hurts with applying pressure causing patient to limp.  Patient has not taken anything for her symptoms.  Wearing slide on athletic flip-flops feels better than wearing tennis shoes.  Past Medical History:  Diagnosis Date   Asthma    childhood   Bipolar 1 disorder (HCC)    Depression     Allergies  Allergen Reactions   Penicillins Hives    Has patient had a PCN reaction causing immediate rash, facial/tongue/throat swelling, SOB or lightheadedness with hypotension: No Has patient had a PCN reaction causing severe rash involving mucus membranes or skin necrosis: No Has patient had a PCN reaction that required hospitalization: No Has patient had a PCN reaction occurring within the last 10 years: No If all of the above answers are "NO", then may proceed with Cephalosporin use.   Latex Itching and Rash    ROS General: Denies fever, chills, night sweats, changes in weight, changes in appetite HEENT: Denies headaches, ear pain, changes in vision, rhinorrhea, sore throat CV: Denies CP, palpitations, SOB, orthopnea Pulm: Denies SOB, cough, wheezing GI: Denies abdominal pain, nausea, vomiting, diarrhea, constipation GU: Denies dysuria, hematuria, frequency, vaginal discharge Msk: Denies muscle cramps, joint pains  + right lateral foot pain Neuro: Denies weakness, numbness, tingling Skin: Denies rashes,  bruising Psych: Denies depression, anxiety, hallucinations     Objective:    Blood pressure 118/70, pulse 71, temperature 98.7 F (37.1 C), temperature source Oral, weight 214 lb 3.2 oz (97.2 kg), SpO2 95 %.  Gen. Pleasant, well-nourished, in no distress, normal affect   HEENT: [place]/AT, face symmetric, conjunctiva clear, no scleral icterus, PERRLA, EOMI, nares patent without drainage Lungs: no accessory muscle use Cardiovascular: RRR, no peripheral edema Musculoskeletal: Mild TTP of right lateral ankle and proximal midfoot.  No pain with application of tuning fork to bones.  No edema, ecchymosis, or erythema.  No deformities, no cyanosis or clubbing, normal tone Neuro:  A&Ox3, CN II-XII intact, normal gait Skin:  Warm, no lesions/ rash   Wt Readings from Last 3 Encounters:  04/28/21 214 lb 3.2 oz (97.2 kg)  03/25/21 215 lb 3.2 oz (97.6 kg)  10/23/20 202 lb (91.6 kg)    Lab Results  Component Value Date   WBC 8.1 03/25/2021   HGB 12.1 03/25/2021   HCT 36.5 03/25/2021   PLT 244.0 03/25/2021   GLUCOSE 77 03/25/2021   CHOL 168 03/25/2021   TRIG 57.0 03/25/2021   HDL 67.20 03/25/2021   LDLCALC 89 03/25/2021   ALT 12 (L) 03/08/2017   AST 19 03/08/2017   NA 139 03/25/2021   K 4.0 03/25/2021   CL 105 03/25/2021   CREATININE 0.93 03/25/2021   BUN 12 03/25/2021   CO2 28 03/25/2021   TSH 1.81 03/25/2021   HGBA1C 5.7 03/25/2021    Assessment/Plan:  Sprain of right  foot, initial encounter  Right foot pain  Discussed symptoms likely 2/2 sprain of right foot/ankle.  Discussed supportive care including compression, elevation, heat, NSAIDs.  Right foot wrapped with Ace bandage in clinic.  Discussed wearing a flat shoe or a walking shoe.  Advised likely to take several weeks to heal.  Discussed obtaining x-ray however if fracture present callus formation already present.  We will wait on imaging.  Obtain for continued symptoms.  Given handouts  F/u prn  Abbe Amsterdam, MD

## 2021-05-24 ENCOUNTER — Encounter: Payer: Self-pay | Admitting: Family Medicine

## 2022-02-14 ENCOUNTER — Emergency Department (HOSPITAL_COMMUNITY)
Admission: EM | Admit: 2022-02-14 | Discharge: 2022-02-14 | Disposition: A | Discharge status: Home / Self Care | Payer: BC Managed Care – PPO | Attending: Emergency Medicine | Admitting: Emergency Medicine

## 2022-02-14 ENCOUNTER — Emergency Department (HOSPITAL_COMMUNITY): Payer: BC Managed Care – PPO

## 2022-02-14 ENCOUNTER — Other Ambulatory Visit: Payer: Self-pay

## 2022-02-14 ENCOUNTER — Encounter (HOSPITAL_COMMUNITY): Payer: Self-pay | Admitting: Emergency Medicine

## 2022-02-14 DIAGNOSIS — J45909 Unspecified asthma, uncomplicated: Secondary | ICD-10-CM | POA: Insufficient documentation

## 2022-02-14 DIAGNOSIS — K5730 Diverticulosis of large intestine without perforation or abscess without bleeding: Secondary | ICD-10-CM | POA: Diagnosis not present

## 2022-02-14 DIAGNOSIS — R1032 Left lower quadrant pain: Secondary | ICD-10-CM | POA: Diagnosis not present

## 2022-02-14 DIAGNOSIS — R1030 Lower abdominal pain, unspecified: Secondary | ICD-10-CM | POA: Diagnosis not present

## 2022-02-14 DIAGNOSIS — N9489 Other specified conditions associated with female genital organs and menstrual cycle: Secondary | ICD-10-CM | POA: Diagnosis not present

## 2022-02-14 LAB — CBC
HCT: 39.2 % (ref 36.0–46.0)
Hemoglobin: 12.6 g/dL (ref 12.0–15.0)
MCH: 28.8 pg (ref 26.0–34.0)
MCHC: 32.1 g/dL (ref 30.0–36.0)
MCV: 89.5 fL (ref 80.0–100.0)
Platelets: 312 10*3/uL (ref 150–400)
RBC: 4.38 MIL/uL (ref 3.87–5.11)
RDW: 14.4 % (ref 11.5–15.5)
WBC: 6.4 10*3/uL (ref 4.0–10.5)
nRBC: 0 % (ref 0.0–0.2)

## 2022-02-14 LAB — COMPREHENSIVE METABOLIC PANEL
ALT: 20 U/L (ref 0–44)
AST: 22 U/L (ref 15–41)
Albumin: 3.8 g/dL (ref 3.5–5.0)
Alkaline Phosphatase: 49 U/L (ref 38–126)
Anion gap: 10 (ref 5–15)
BUN: 12 mg/dL (ref 6–20)
CO2: 20 mmol/L — ABNORMAL LOW (ref 22–32)
Calcium: 8.5 mg/dL — ABNORMAL LOW (ref 8.9–10.3)
Chloride: 108 mmol/L (ref 98–111)
Creatinine, Ser: 0.86 mg/dL (ref 0.44–1.00)
GFR, Estimated: >60 mL/min (ref >60)
Glucose, Bld: 92 mg/dL (ref 70–99)
Potassium: 3.7 mmol/L (ref 3.5–5.1)
Sodium: 138 mmol/L (ref 135–145)
Total Bilirubin: 0.4 mg/dL (ref 0.3–1.2)
Total Protein: 7.1 g/dL (ref 6.5–8.1)

## 2022-02-14 LAB — URINALYSIS, ROUTINE W REFLEX MICROSCOPIC
Bilirubin Urine: NEGATIVE
Glucose, UA: NEGATIVE mg/dL
Hgb urine dipstick: NEGATIVE
Ketones, ur: NEGATIVE mg/dL
Nitrite: NEGATIVE
Protein, ur: NEGATIVE mg/dL
Specific Gravity, Urine: 1.024 (ref 1.005–1.030)
pH: 6 (ref 5.0–8.0)

## 2022-02-14 LAB — LIPASE, BLOOD: Lipase: 22 U/L (ref 11–51)

## 2022-02-14 LAB — I-STAT BETA HCG BLOOD, ED (MC, WL, AP ONLY): I-stat hCG, quantitative: <5 m[IU]/mL (ref <5)

## 2022-02-14 MED ORDER — IOHEXOL 300 MG/ML  SOLN
100.0000 mL | Freq: Once | INTRAMUSCULAR | Status: AC | PRN
  Administered 2022-02-14: 100 mL via INTRAVENOUS

## 2022-02-14 NOTE — ED Provider Notes (Signed)
?[facility]-EMERGENCY DEPT ?Provider Note ? ? ?CSN: [id number] ?Arrival date & time: 02/14/22  0740 ? ?  ? ?History ?Chief Complaint  ?Patient presents with  ? Abdominal Pain  ? Back Pain  ? ? ?Brandi Ball is a 31 y.o. female who presents to the emergency department today with chronic abdominal pain but has been worse over the last week or so.  She complains of abdominal pain localized to the lower abdomen which radiates to the left side of the back.  She denies any urinary complaints, fever, chills.  She does report associated increase in bowel frequency which she describes as diarrhea.  No hematochezia or melena.  ? ? ?Abdominal Pain ?Back Pain ?Associated symptoms: abdominal pain   ? ?  ? ?Home Medications ?Prior to Admission medications   ?Medication Sig Start Date End Date Taking? Authorizing Provider  ?albuterol (PROVENTIL HFA;VENTOLIN HFA) 108 (90 Base) MCG/ACT inhaler Inhale 2 puffs into the lungs every 4 (four) hours as needed for wheezing or shortness of breath. 06/23/16   Joy, Hillard Danker, PA-C  ?aspirin-acetaminophen-caffeine (EXCEDRIN MIGRAINE) [phone] MG tablet Take 2 tablets by mouth every 6 (six) hours as needed for headache.    [provider]  ?beclomethasone (QVAR) 40 MCG/ACT inhaler Inhale 2 puffs into the lungs 2 (two) times daily. 06/05/19   Deeann Saint, MD  ?cyclobenzaprine (FLEXERIL) 10 MG tablet Take 1 tablet (10 mg total) by mouth 2 (two) times daily as needed for muscle spasms. 09/16/16   Hedges, Tinnie Gens, PA-C  ?guaiFENesin (MUCINEX) 600 MG 12 hr tablet Take 1 tablet (600 mg total) by mouth 2 (two) times daily. 11/18/18   Couture, Cortni S, PA-C  ?lidocaine (LIDODERM) 5 % Place 1 patch onto the skin daily. Remove & Discard patch within 12 hours or as directed by MD ?Patient taking differently: Place 1 patch onto the skin as needed. Remove & Discard patch within 12 hours or as directed by MD 09/23/16   Harolyn Rutherford C, PA-C  ?loratadine (CLARITIN) 10 MG tablet  Take 1 tablet (10 mg total) by mouth daily. 06/05/19   Deeann Saint, MD  ?methocarbamol (ROBAXIN) 500 MG tablet Take 1 tablet (500 mg total) by mouth 2 (two) times daily. 09/23/16   Joy, Shawn C, PA-C  ?ondansetron (ZOFRAN) 4 MG tablet Take 1 tablet (4 mg total) by mouth every 8 (eight) hours as needed for nausea or vomiting. ?Patient not taking: No sig reported 10/23/20   Sponseller, Lupe Carney R, PA-C  ?phenazopyridine (PYRIDIUM) 200 MG tablet Take 1 tablet (200 mg total) by mouth 3 (three) times daily. ?Patient not taking: No sig reported 08/01/16   Rasch, Victorino Dike I, NP  ?SUMAtriptan (IMITREX) 50 MG tablet Take 1 tablet (50 mg total) by mouth every 2 (two) hours as needed for migraine. May repeat in 2 hours if headache persists or recurs. 06/13/19   Deeann Saint, MD  ?triamcinolone cream (KENALOG) 0.5 % Apply 1 application topically 3 (three) times daily. ?Patient not taking: No sig reported 08/09/17   Aviva Signs, CNM  ?   ? ?Allergies    ?Penicillins and Latex   ? ?Review of Systems   ?Review of Systems  ?Gastrointestinal:  Positive for abdominal pain.  ?Musculoskeletal:  Positive for back pain.  ?All other systems reviewed and are negative. ? ?Physical Exam ?Updated Vital Signs ?BP (!) 143/81   Pulse 60   Temp 98.5 ?F (36.9 ?C) (Oral)   Resp 15   SpO2 99%  ?  Physical Exam ?Vitals and nursing note reviewed.  ?Constitutional:   ?   General: She is not in acute distress. ?   Appearance: Normal appearance.  ?HENT:  ?   Head: Normocephalic and atraumatic.  ?Eyes:  ?   General:     ?   Right eye: No discharge.     ?   Left eye: No discharge.  ?Cardiovascular:  ?   Comments: Regular rate and rhythm.  S1/S2 are distinct without any evidence of murmur, rubs, or gallops.  Radial pulses are 2+ bilaterally.  Dorsalis pedis pulses are 2+ bilaterally.  No evidence of pedal edema. ?Pulmonary:  ?   Comments: Clear to auscultation bilaterally.  Normal effort.  No respiratory distress.  No evidence of wheezes, rales,  or rhonchi heard throughout. ?Abdominal:  ?   General: Abdomen is flat. Bowel sounds are normal. There is no distension.  ?   Tenderness: There is no guarding or rebound.  ?   Comments: Mild lower abdominal tenderness.  ?Musculoskeletal:     ?   General: Normal range of motion.  ?   Cervical back: Neck supple.  ?Skin: ?   General: Skin is warm and dry.  ?   Findings: No rash.  ?Neurological:  ?   General: No focal deficit present.  ?   Mental Status: She is alert.  ?Psychiatric:     ?   Mood and Affect: Mood normal.     ?   Behavior: Behavior normal.  ? ? ?ED Results / Procedures / Treatments   ?Labs ?(all labs ordered are listed, but only abnormal results are displayed) ?Labs Reviewed  ?COMPREHENSIVE METABOLIC PANEL - Abnormal; Notable for the following components:  ?    Result Value  ? CO2 20 (*)   ? Calcium 8.5 (*)   ? All other components within normal limits  ?URINALYSIS, ROUTINE W REFLEX MICROSCOPIC - Abnormal; Notable for the following components:  ? APPearance HAZY (*)   ? Leukocytes,Ua SMALL (*)   ? Bacteria, UA RARE (*)   ? All other components within normal limits  ?LIPASE, BLOOD  ?CBC  ?I-STAT BETA HCG BLOOD, ED ([facility], [facility], [facility] ONLY)  ? ? ?EKG ?None ? ?Radiology ?CT ABDOMEN PELVIS WO CONTRAST ? ?Addendum Date: 02/14/2022   ?ADDENDUM REPORT: 02/14/2022 12:48 ADDENDUM: Addendum regarding further paucity of intravenous contrast visualized on this examination. Patient was subsequently re-evaluated by the CT technologist, emergency room physician and radiology PA at which point they were found to have extravasation of contrast material in the forearm. Subsequently she is been treated for this and is being monitored/cared for by the radiology PA and emergency room physician. EXAMINATION SHOULD BE BILLED AS A NON CONTRASTED CT OF THE ABDOMEN AND PELVIS. Electronically Signed   By: Maudry Mayhew M.D.   On: 02/14/2022 12:48  ? ?Result Date: 02/14/2022 ?CLINICAL DATA:  Nonlocalized lower abdominal pain. EXAM: CT ABDOMEN  AND PELVIS WITH CONTRAST TECHNIQUE: Multidetector CT imaging of the abdomen and pelvis was performed using the standard protocol following bolus administration of intravenous contrast. RADIATION DOSE REDUCTION: This exam was performed according to the departmental dose-optimization program which includes automated exposure control, adjustment of the mA and/or kV according to patient size and/or use of iterative reconstruction technique. CONTRAST:  OMNIPAQUE IOHEXOL 300 MG/ML  SOLN COMPARISON:  Abdominal ultrasound Mar 06, 2017 FINDINGS: No intravenous contrast material is identified in the vasculature or abdominopelvic structures on this examination. Lower chest: 4 mm left lower lobe  pulmonary nodule on image 10/3 Hepatobiliary: No suspicious hepatic lesion. Gallbladder is unremarkable. No biliary ductal dilation. Pancreas: No pancreatic ductal dilation or evidence of acute inflammation. Spleen: No splenomegaly or focal splenic lesion. Adrenals/Urinary Tract: Bilateral adrenal glands are within normal limits. No hydronephrosis. No renal, ureteral or bladder calculi identified. Stomach/Bowel: No radiopaque enteric contrast material was administered. Stomach is unremarkable for degree of distension. No pathologic dilation of small or large bowel. Terminal ileum appears normal. Appendix is not confidently identified however there is no pericecal inflammation. Colonic diverticulosis without findings of acute diverticulitis. Vascular/Lymphatic: Normal caliber abdominal aorta. No pathologically enlarged abdominal or pelvic lymph nodes. Reproductive: Pelvic reproductive structures are within normal limits for a premenopausal female. Other: Trace pelvic free fluid is within physiologic normal limits. Musculoskeletal: No acute or significant osseous findings. IMPRESSION: No significant intravenous contrast material visualized on the examination, limiting the evaluation. On discussion with the technologist the patient  did not report contrast extravasation and none was visualized by the technologist at the time of examination. However, peripheral venous access in this patient was difficult and required ultrasound guida

## 2022-02-14 NOTE — Discharge Instructions (Signed)
Your work-up today was reassuring for any emergent pathology which is good news.  I would like for you to follow-up with the GI doctor for further evaluation.  Please return to the emergency department for any worsening symptoms you might have. ?

## 2022-02-14 NOTE — ED Provider Triage Note (Signed)
Emergency Medicine Provider Triage Evaluation Note ? ?Brandi Ball , a 31 y.o. female  was evaluated in triage.  Pt complains of lower abdominal pain intermittently over the last week.  Pain is worse in the left lower quadrant.  Patient is also noted increase in bowel frequency which she describes diarrhea.  No blood in the stool.  Also complaining of nausea.  Pain is severe at its peak.  No urinary complaints or vaginal symptoms.  Pain does radiate into the left side of the lower back. ? ?Review of Systems  ?Positive:  ?Negative: Fever, chills. ? ?Physical Exam  ?BP (!) 153/95 (BP Location: Left Arm)   Pulse 94   Temp 98.5 ?F (36.9 ?C) (Oral)   Resp 18   SpO2 94%  ?Gen:   Awake, no distress   ?Resp:  Normal effort  ?MSK:   Moves extremities without difficulty  ?Other:  Mild lower abdominal tenderness worse on the left ? ?Medical Decision Making  ?Medically screening exam initiated at 8:03 AM.  Appropriate orders placed.  Brandi Ball was informed that the remainder of the evaluation will be completed by another provider, this initial triage assessment does not replace that evaluation, and the importance of remaining in the ED until their evaluation is complete. ? ? ?  ?Honor Loh [place], PA-C ?02/14/22 1610 ? ?

## 2022-02-14 NOTE — ED Triage Notes (Signed)
Pt reports abd pain x1week and back pain on the left side. Pt reports diarrhea and nausea as well.  ?

## 2022-02-25 ENCOUNTER — Ambulatory Visit
Admission: RE | Admit: 2022-02-25 | Discharge: 2022-02-25 | Disposition: A | Discharge status: Home / Self Care | Payer: BC Managed Care – PPO | Source: Ambulatory Visit | Attending: Gastroenterology | Admitting: Gastroenterology

## 2022-02-25 ENCOUNTER — Other Ambulatory Visit: Payer: Self-pay | Admitting: Gastroenterology

## 2022-02-25 DIAGNOSIS — R1030 Lower abdominal pain, unspecified: Secondary | ICD-10-CM | POA: Diagnosis not present

## 2022-02-25 DIAGNOSIS — R198 Other specified symptoms and signs involving the digestive system and abdomen: Secondary | ICD-10-CM

## 2022-02-25 DIAGNOSIS — R109 Unspecified abdominal pain: Secondary | ICD-10-CM | POA: Diagnosis not present

## 2022-02-25 DIAGNOSIS — R195 Other fecal abnormalities: Secondary | ICD-10-CM | POA: Diagnosis not present

## 2022-03-28 ENCOUNTER — Encounter: Payer: Self-pay | Admitting: Family Medicine

## 2022-03-28 ENCOUNTER — Ambulatory Visit (INDEPENDENT_AMBULATORY_CARE_PROVIDER_SITE_OTHER): Payer: BC Managed Care – PPO | Admitting: Family Medicine

## 2022-03-28 VITALS — BP 112/72 | HR 83 | Temp 98.4°F | Ht 64.0 in | Wt 228.8 lb

## 2022-03-28 DIAGNOSIS — Z0000 Encounter for general adult medical examination without abnormal findings: Secondary | ICD-10-CM

## 2022-03-28 DIAGNOSIS — F319 Bipolar disorder, unspecified: Secondary | ICD-10-CM | POA: Diagnosis not present

## 2022-03-28 LAB — CBC WITH DIFFERENTIAL/PLATELET
Basophils Absolute: 0.1 10*3/uL (ref 0.0–0.1)
Basophils Relative: 1.1 % (ref 0.0–3.0)
Eosinophils Absolute: 0.3 10*3/uL (ref 0.0–0.7)
Eosinophils Relative: 5 % (ref 0.0–5.0)
HCT: 38.1 % (ref 36.0–46.0)
Hemoglobin: 12.6 g/dL (ref 12.0–15.0)
Lymphocytes Relative: 33.5 % (ref 12.0–46.0)
Lymphs Abs: 2.1 10*3/uL (ref 0.7–4.0)
MCHC: 33.2 g/dL (ref 30.0–36.0)
MCV: 89.1 fl (ref 78.0–100.0)
Monocytes Absolute: 0.4 10*3/uL (ref 0.1–1.0)
Monocytes Relative: 7.2 % (ref 3.0–12.0)
Neutro Abs: 3.3 10*3/uL (ref 1.4–7.7)
Neutrophils Relative %: 53.2 % (ref 43.0–77.0)
Platelets: 270 10*3/uL (ref 150.0–400.0)
RBC: 4.28 Mil/uL (ref 3.87–5.11)
RDW: 14.3 % (ref 11.5–15.5)
WBC: 6.2 10*3/uL (ref 4.0–10.5)

## 2022-03-28 LAB — BASIC METABOLIC PANEL
BUN: 10 mg/dL (ref 6–23)
CO2: 30 mEq/L (ref 19–32)
Calcium: 9.2 mg/dL (ref 8.4–10.5)
Chloride: 103 mEq/L (ref 96–112)
Creatinine, Ser: 0.93 mg/dL (ref 0.40–1.20)
GFR: 81.06 mL/min (ref >60.00)
Glucose, Bld: 104 mg/dL — ABNORMAL HIGH (ref 70–99)
Potassium: 3.8 mEq/L (ref 3.5–5.1)
Sodium: 139 mEq/L (ref 135–145)

## 2022-03-28 LAB — LIPID PANEL
Cholesterol: 170 mg/dL (ref 0–200)
HDL: 82.1 mg/dL (ref >39.00)
LDL Cholesterol: 76 mg/dL (ref 0–99)
NonHDL: 87.98
Total CHOL/HDL Ratio: 2
Triglycerides: 62 mg/dL (ref 0.0–149.0)
VLDL: 12.4 mg/dL (ref 0.0–40.0)

## 2022-03-28 LAB — TSH: TSH: 1.66 u[IU]/mL (ref 0.35–5.50)

## 2022-03-28 LAB — T4, FREE: Free T4: 0.98 ng/dL (ref 0.60–1.60)

## 2022-03-28 LAB — HEMOGLOBIN A1C: Hgb A1c MFr Bld: 5.8 % (ref 4.6–6.5)

## 2022-03-28 MED ORDER — LURASIDONE HCL 20 MG PO TABS: 20.0000 mg | ORAL_TABLET | Freq: Every day | ORAL | 3 refills | Status: AC

## 2022-03-28 NOTE — Progress Notes (Signed)
Subjective:     Brandi Ball is a 31 y.o. female and is here for a comprehensive physical exam.  Pt is not fasting.  Pt with h/o bipolar depression.  Pt interested in restarting medication.  In the past was on latuda, but remembers feeling "extra calm".    Social History   Socioeconomic History   Marital status: Single    Spouse name: Not on file   Number of children: Not on file   Years of education: Not on file   Highest education level: Not on file  Occupational History   Not on file  Tobacco Use   Smoking status: Every Day    Packs/day: 0.50    Types: Cigarettes   Smokeless tobacco: Never  Substance and Sexual Activity   Alcohol use: Yes    Comment: wine   Drug use: No   Sexual activity: Yes    Birth control/protection: None  Other Topics Concern   Not on file  Social History Narrative   Not on file   Social Determinants of Health   Financial Resource Strain: Not on file  Food Insecurity: Not on file  Transportation Needs: Not on file  Physical Activity: Not on file  Stress: Not on file  Social Connections: Not on file  Intimate Partner Violence: Not on file   Health Maintenance  Topic Date Due   COVID-19 Vaccine (1) Never done   Hepatitis C Screening  Never done   TETANUS/TDAP  Never done   PAP SMEAR-Modifier  Never done   INFLUENZA VACCINE  05/10/2022   HIV Screening  Completed   HPV VACCINES  Aged Out    The following portions of the patient's history were reviewed and updated as appropriate: allergies, current medications, past family history, past medical history, past social history, past surgical history, and problem list.  Review of Systems Pertinent items noted in HPI and remainder of comprehensive ROS otherwise negative.   Objective:    BP 112/72 (BP Location: Left Arm, Patient Position: Sitting, Cuff Size: Large)   Pulse 83   Temp 98.4 F (36.9 C) (Oral)   Ht 5\' 4"  (1.626 m)   Wt 228 lb 12.8 oz (103.8 kg)   SpO2 99%   BMI 39.27  kg/m  General appearance: alert, cooperative, and no distress Head: Normocephalic, without obvious abnormality, atraumatic Eyes: conjunctivae/corneas clear. PERRL, EOM's intact. Fundi benign. Ears: normal TM's and external ear canals both ears Nose: Nares normal. Septum midline. Mucosa normal. No drainage or sinus tenderness. Throat: lips, mucosa, and tongue normal; teeth and gums normal Neck: no adenopathy, no carotid bruit, no JVD, supple, symmetrical, trachea midline, and thyroid not enlarged, symmetric, no tenderness/mass/nodules Lungs: clear to auscultation bilaterally Heart: regular rate and rhythm, S1, S2 normal, no murmur, click, rub or gallop Abdomen: soft, non-tender; bowel sounds normal; no masses,  no organomegaly Extremities: extremities normal, atraumatic, no cyanosis or edema Pulses: 2+ and symmetric Skin: Skin color, texture, turgor normal. No rashes or lesions Lymph nodes: Cervical, supraclavicular, and axillary nodes normal. Neurologic: Alert and oriented X 3, normal strength and tone. Normal symmetric reflexes. Normal coordination and gait      03/28/2022    9:10 AM 03/25/2021    9:21 AM 06/05/2019    1:46 PM  Depression screen PHQ 2/9  Decreased Interest 2 2 2   Down, Depressed, Hopeless 3 1 2   PHQ - 2 Score 5 3 4   Altered sleeping 3 3 3   Tired, decreased energy 2 2 3  Change in appetite 2 2 2   Feeling bad or failure about yourself  3 1 2   Trouble concentrating 3 1 1   Moving slowly or fidgety/restless 0 0 0  Suicidal thoughts 1 0 1  PHQ-9 Score 19 12 16   Difficult doing work/chores Very difficult Somewhat difficult Somewhat difficult     Assessment:    Healthy female exam     Plan:    Anticipatory guidance given including wearing seatbelts, smoke detectors in the home, increasing physical activity, increasing p.o. intake of water and vegetables. -last pap 10/2018.  If done with HPV testing repeat due in 2025, if done without due this yr. -immunizations  reviewed. -given handout -next CPE in 1 yr See After Visit Summary for Counseling Recommendations   - Plan: CBC with Differential/Platelet, Basic metabolic panel, Hemoglobin A1c, Lipid panel  Bipolar depression (HCC) -phq 9 score this visit 19 -discussed counseling and medication options.  Given info on area [facility] providers.  Pt to schedule appt with psychiatry. -will restart latuda while pt waiting for an appt. In the past latuda made pt feel really calm.   -Given strict precautions -Continue to monitor  - Plan: lurasidone (LATUDA) 20 MG TABS tablet, TSH, T4, Free  Follow-up in 4 weeks, sooner if needed  , MD

## 2022-03-28 NOTE — Patient Instructions (Addendum)
We can try restarting Latuda 20 mg daily.  For a lot of medications it may take anywhere from 4-6 weeks before you notice a difference in symptoms.  If the Latuda makes you feel extra calm like it did in the past we can try finding a different medication.  Do not forget to call and inquire about counseling services.  We will have you follow back up in the next 4 weeks to see how you are feeling.  You can always come back sooner if needed

## 2022-05-04 ENCOUNTER — Telehealth (INDEPENDENT_AMBULATORY_CARE_PROVIDER_SITE_OTHER): Payer: BC Managed Care – PPO | Admitting: Family Medicine

## 2022-05-04 ENCOUNTER — Ambulatory Visit: Payer: BC Managed Care – PPO | Admitting: Family Medicine

## 2022-05-04 DIAGNOSIS — J029 Acute pharyngitis, unspecified: Secondary | ICD-10-CM | POA: Diagnosis not present

## 2022-05-04 DIAGNOSIS — U071 COVID-19: Secondary | ICD-10-CM

## 2022-05-04 DIAGNOSIS — J3489 Other specified disorders of nose and nasal sinuses: Secondary | ICD-10-CM

## 2022-05-04 LAB — POCT RAPID STREP A (OFFICE): Rapid Strep A Screen: NEGATIVE

## 2022-05-04 LAB — POC COVID19 BINAXNOW: SARS Coronavirus 2 Ag: POSITIVE — AB

## 2022-05-04 MED ORDER — MOLNUPIRAVIR EUA 200MG CAPSULE: 4.0000 | ORAL_CAPSULE | Freq: Two times a day (BID) | ORAL | 0 refills | Status: DC

## 2022-05-04 MED ORDER — MOLNUPIRAVIR EUA 200MG CAPSULE: 4.0000 | ORAL_CAPSULE | Freq: Two times a day (BID) | ORAL | 0 refills | Status: AC

## 2022-05-04 NOTE — Progress Notes (Signed)
Virtual Visit via Video Note  I connected with Brandi Ball on 05/04/22 at 10:30 AM EDT by a video enabled telemedicine application 2/2 COVID-19 pandemic and verified that I am speaking with the correct person using two identifiers.  Location patient: home Location provider:work or home office Persons participating in the virtual visit: patient, provider  I discussed the limitations of evaluation and management by telemedicine and the availability of in person appointments. The patient expressed understanding and agreed to proceed.  Chief Complaint  Patient presents with   Ear Pain   Sore Throat   Nasal Congestion    Started x 4 days ago & has progressively gotten worse with additional symptoms. Has used a cough syrup, taking Aleve, using a nasal spray. Denies fever. Pt has asthma & has noticed since last night that she was having SOB at work last pm; SOB with activity. Does not have SOB at this time.   Headache    HPI:  Pt notes increased stress prior to her upcoming move.  Pt thought allergies were bothering her, but this past Sat had scratchy throat.  Developed nasal congestion, SOB, cough, rhinorrhea, pressure behind eyes, pressure in ears. Cough worse at night.  Tried OTC cough syrup, nasal spray but it burns, lozenges, hot tea, OJ, water.  Pt had a positive home COVID test yesterday, but wasn't sure if it was accurate as it may have been expired.  Pt states this will be the 3rd time she has had COVID.  Last COVID vaccine 08/29/2020 First Pfizer COVID vaccine 01/18/20, then second dose 02/12/20.  Pt has not picked up Latuda yet.  States was told med was $90.  Pt looking into therapist, interested in online options like better help.  ROS: See pertinent positives and negatives per HPI.  Past Medical History:  Diagnosis Date   Asthma    childhood   Bipolar 1 disorder (HCC)    Depression     Past Surgical History:  Procedure Laterality Date   ABDOMINAL SURGERY      No  family history on file.    Current Outpatient Medications:    aspirin-acetaminophen-caffeine (EXCEDRIN MIGRAINE) 250-250-65 MG tablet, Take 2 tablets by mouth every 6 (six) hours as needed for headache., Disp: , Rfl:    beclomethasone (QVAR) 40 MCG/ACT inhaler, Inhale 2 puffs into the lungs 2 (two) times daily., Disp: 10.6 g, Rfl: 5   guaiFENesin (MUCINEX) 600 MG 12 hr tablet, Take 1 tablet (600 mg total) by mouth 2 (two) times daily., Disp: 6 tablet, Rfl: 0   loratadine (CLARITIN) 10 MG tablet, Take 1 tablet (10 mg total) by mouth daily., Disp: 90 tablet, Rfl: 3   lurasidone (LATUDA) 20 MG TABS tablet, Take 1 tablet (20 mg total) by mouth daily., Disp: 30 tablet, Rfl: 3   naproxen sodium (ALEVE) 220 MG tablet, Take 220 mg by mouth daily as needed., Disp: , Rfl:    albuterol (PROVENTIL HFA;VENTOLIN HFA) 108 (90 Base) MCG/ACT inhaler, Inhale 2 puffs into the lungs every 4 (four) hours as needed for wheezing or shortness of breath. (Patient not taking: Reported on 05/04/2022), Disp: 1 Inhaler, Rfl: 1   cyclobenzaprine (FLEXERIL) 10 MG tablet, Take 1 tablet (10 mg total) by mouth 2 (two) times daily as needed for muscle spasms. (Patient not taking: Reported on 05/04/2022), Disp: 20 tablet, Rfl: 0   lidocaine (LIDODERM) 5 %, Place 1 patch onto the skin daily. Remove & Discard patch within 12 hours or as directed by MD (Patient  not taking: Reported on 05/04/2022), Disp: 30 patch, Rfl: 0   methocarbamol (ROBAXIN) 500 MG tablet, Take 1 tablet (500 mg total) by mouth 2 (two) times daily. (Patient not taking: Reported on 05/04/2022), Disp: 20 tablet, Rfl: 0   ondansetron (ZOFRAN) 4 MG tablet, Take 1 tablet (4 mg total) by mouth every 8 (eight) hours as needed for nausea or vomiting. (Patient not taking: Reported on 05/04/2022), Disp: 12 tablet, Rfl: 0   phenazopyridine (PYRIDIUM) 200 MG tablet, Take 1 tablet (200 mg total) by mouth 3 (three) times daily. (Patient not taking: Reported on 03/25/2021), Disp: 6 tablet,  Rfl: 0   SUMAtriptan (IMITREX) 50 MG tablet, Take 1 tablet (50 mg total) by mouth every 2 (two) hours as needed for migraine. May repeat in 2 hours if headache persists or recurs. (Patient not taking: Reported on 05/04/2022), Disp: 10 tablet, Rfl: 4   triamcinolone cream (KENALOG) 0.5 %, Apply 1 application topically 3 (three) times daily. (Patient not taking: Reported on 03/25/2021), Disp: 30 g, Rfl: 0  EXAM:  VITALS per patient if applicable: RR between 12-20 bpm  GENERAL: alert, oriented, appears well and in no acute distress  HEENT: atraumatic, conjunctiva clear, no obvious abnormalities on inspection of external nose and ears  NECK: normal movements of the head and neck  LUNGS: on inspection no signs of respiratory distress, breathing rate appears normal, no obvious gross SOB, gasping or wheezing  CV: no obvious cyanosis  MS: moves all visible extremities without noticeable abnormality  PSYCH/NEURO: pleasant and cooperative, no obvious depression or anxiety, speech and thought processing grossly intact  ASSESSMENT AND PLAN:  Discussed the following assessment and plan:  COVID-19 virus infection  -positive test 05/03/22 and 05/04/22.  Symptoms starting 7/22. -discussed r/b/a of antiviral. Pt wishes to start Molnupiravir. -continue supportive care including OTC cough/cold medications, antihistamines, rest, hydration -Given strict precautions - Plan: molnupiravir EUA (LAGEVRIO) 200 mg CAPS capsule  Sinus pressure -COVID testing positive. - Plan: POC COVID-19 BinaxNow  Sore throat -Rapid strep test negative  - Plan: POCT rapid strep A  Follow-up as needed   I discussed the assessment and treatment plan with the patient. The patient was provided an opportunity to ask questions and all were answered. The patient agreed with the plan and demonstrated an understanding of the instructions.   The patient was advised to call back or seek an in-person evaluation if the symptoms  worsen or if the condition fails to improve as anticipated.  Deeann Saint, MD

## 2023-02-08 ENCOUNTER — Encounter: Payer: Self-pay | Admitting: Family Medicine

## 2023-02-08 ENCOUNTER — Ambulatory Visit (INDEPENDENT_AMBULATORY_CARE_PROVIDER_SITE_OTHER): Payer: BC Managed Care – PPO | Admitting: Family Medicine

## 2023-02-08 VITALS — BP 128/80 | HR 74 | Temp 98.7°F | Wt 198.6 lb

## 2023-02-08 DIAGNOSIS — M722 Plantar fascial fibromatosis: Secondary | ICD-10-CM

## 2023-02-08 DIAGNOSIS — D170 Benign lipomatous neoplasm of skin and subcutaneous tissue of head, face and neck: Secondary | ICD-10-CM

## 2023-02-08 DIAGNOSIS — Z3009 Encounter for other general counseling and advice on contraception: Secondary | ICD-10-CM

## 2023-02-08 DIAGNOSIS — F319 Bipolar disorder, unspecified: Secondary | ICD-10-CM | POA: Diagnosis not present

## 2023-02-08 DIAGNOSIS — Z1159 Encounter for screening for other viral diseases: Secondary | ICD-10-CM

## 2023-02-08 DIAGNOSIS — E049 Nontoxic goiter, unspecified: Secondary | ICD-10-CM | POA: Diagnosis not present

## 2023-02-08 LAB — POCT URINE PREGNANCY: Preg Test, Ur: NEGATIVE

## 2023-02-08 NOTE — Progress Notes (Signed)
Established Patient Office Visit   Subjective  Patient ID: Brandi Ball, female    DOB: 1991-08-23  Age: 32 y.o. MRN: [id number]  Chief Complaint  Patient presents with   Follow-up    Has not had pap  and would like to discuss ocp options.   Neck Pain    Left side pain, hurts to turn to the right, sore when presses on it.     Pt is a 32 yo female seen for f/u and several ongoing concerns.  Pt initially thought she was due for CPE, last done 03/28/22.  LMP 4/17-4/21/24.  Endorses having recent unprotected sex.  Pt interested in birth control options.  Does not feel can remember pill daily or changing out patch/ring.  Had Depo in the past.  Caused weight gain.  Patient with a bump on back of neck.  States is getting larger and now becoming painful.  Causing numbness in left upper extremity if sleeps on side.  States doing well with mental health.  Never picked up Latuda 2/2 cost.  Patient notes left plantar foot pain.  Bottom of foot feels tight.  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     Patient Active Problem List   Diagnosis Date Noted   Migraine 06/06/2019   Seasonal allergies 06/06/2019   Cigarette nicotine dependence without complication 06/06/2019   Anxiety and depression 06/06/2019   Moderate persistent asthma without complication 06/06/2019   Irregular menstrual cycle 04/27/2015   Cough 06/06/2014   URI (upper respiratory infection) 06/06/2014   Past Surgical History:  Procedure Laterality Date   ABDOMINAL SURGERY     Social History   Tobacco Use   Smoking status: Every Day    Packs/day: .5    Types: Cigarettes   Smokeless tobacco: Never  Substance Use Topics   Alcohol use: Yes    Comment: wine   Drug use: No   History reviewed. No pertinent family history. Allergies  Allergen Reactions   Penicillins Hives    Has patient had a PCN reaction causing immediate rash, facial/tongue/throat swelling, SOB or lightheadedness with  hypotension: No Has patient had a PCN reaction causing severe rash involving mucus membranes or skin necrosis: No Has patient had a PCN reaction that required hospitalization: No Has patient had a PCN reaction occurring within the last 10 years: No If all of the above answers are "NO", then may proceed with Cephalosporin use.   Latex Itching and Rash    ROS Negative unless stated above    Objective:     BP 128/80 (BP Location: Left Arm, Patient Position: Sitting, Cuff Size: Normal)   Pulse 74   Temp 98.7 F (37.1 C) (Oral)   Wt 198 lb 9.6 oz (90.1 kg)   SpO2 99%   BMI 34.09 kg/m LMP 4/17-21   Physical Exam Constitutional:      General: She is not in acute distress.    Appearance: Normal appearance.  HENT:     Head: Normocephalic and atraumatic.     Nose: Nose normal.     Mouth/Throat:     Mouth: Mucous membranes are moist.  Neck:     Comments: Goiter on exam Cardiovascular:     Rate and Rhythm: Normal rate and regular rhythm.     Heart sounds: Normal heart sounds. No murmur heard.    No gallop.  Pulmonary:     Effort: Pulmonary effort is normal. No respiratory distress.     Breath sounds:  Normal breath sounds. No wheezing, rhonchi or rales.  Skin:    General: Skin is warm and dry.       Neurological:     Mental Status: She is alert and oriented to person, place, and time.      02/08/2023    4:12 PM 03/28/2022    9:10 AM 03/25/2021    9:21 AM 06/05/2019    1:46 PM  Depression screen PHQ 2/9  Decreased Interest 1 2 2 2   Down, Depressed, Hopeless 2 3 1 2   PHQ - 2 Score 3 5 3 4   Altered sleeping 3 3 3 3   Tired, decreased energy 3 2 2 3   Change in appetite 2 2 2 2   Feeling bad or failure about yourself  2 3 1 2   Trouble concentrating 1 3 1 1   Moving slowly or fidgety/restless 0 0 0 0  Suicidal thoughts 1 1 0 1  PHQ-9 Score 15 19 12 16   Difficult doing work/chores Somewhat difficult Very difficult Somewhat difficult Somewhat difficult      02/08/2023    4:13  PM 03/25/2021    9:22 AM 06/05/2019    1:47 PM  GAD 7 : Generalized Anxiety Score  Nervous, Anxious, on Edge 2 0 2  Control/stop worrying 3 2 3   Worry too much - different things 2 2 3   Trouble relaxing 1 0 2  Restless 2 1 2   Easily annoyed or irritable 2 1 3   Afraid - awful might happen 1 3 2   Total GAD 7 Score 13 9 17   Anxiety Difficulty Somewhat difficult Somewhat difficult Somewhat difficult      Assessment & Plan:  Lipoma of neck -     Ambulatory referral to Plastic Surgery  Goiter -     TSH -     T4, free -     Basic metabolic panel  Encounter for other general counseling or advice on contraception -Urine pregnancy negative -Discussed various contraception choices.  Patient to review and notify clinic with decision. -     POCT urine pregnancy  Bipolar depression (HCC) -Stable -PHQ-9 score 15 -GAD-7 score 13 -Counseling encouraged -     TSH -     T4, free -     Basic metabolic panel  Encounter for hepatitis C screening test for low risk patient -     Hepatitis C antibody  Plantar fasciitis of left foot -Discussed wearing supportive shoes, insoles, stretching, and other supportive care -For continued or worsening symptoms referral to podiatry for steroid injection    On day of service, 34 minutes spent caring for this patient face-to-face, reviewing the chart, counseling and/or coordinating care for plan and treatment of diagnosis below.    No follow-ups on file.   Deeann Saint, MD

## 2023-02-09 LAB — TSH: TSH: 1.89 u[IU]/mL (ref 0.35–5.50)

## 2023-02-09 LAB — BASIC METABOLIC PANEL
BUN: 9 mg/dL (ref 6–23)
CO2: 25 mEq/L (ref 19–32)
Calcium: 9.2 mg/dL (ref 8.4–10.5)
Chloride: 106 mEq/L (ref 96–112)
Creatinine, Ser: 1.07 mg/dL (ref 0.40–1.20)
GFR: 68.09 mL/min (ref >60.00)
Glucose, Bld: 61 mg/dL — ABNORMAL LOW (ref 70–99)
Potassium: 3.9 mEq/L (ref 3.5–5.1)
Sodium: 138 mEq/L (ref 135–145)

## 2023-02-09 LAB — T4, FREE: Free T4: 0.97 ng/dL (ref 0.60–1.60)

## 2023-02-09 LAB — HEPATITIS C ANTIBODY: Hepatitis C Ab: NONREACTIVE

## 2023-03-16 ENCOUNTER — Ambulatory Visit (INDEPENDENT_AMBULATORY_CARE_PROVIDER_SITE_OTHER): Payer: BC Managed Care – PPO | Admitting: Plastic Surgery

## 2023-03-16 ENCOUNTER — Encounter: Payer: Self-pay | Admitting: Plastic Surgery

## 2023-03-16 VITALS — BP 120/77 | HR 81 | Ht 63.0 in | Wt 190.8 lb

## 2023-03-16 DIAGNOSIS — R52 Pain, unspecified: Secondary | ICD-10-CM

## 2023-03-16 DIAGNOSIS — M542 Cervicalgia: Secondary | ICD-10-CM | POA: Diagnosis not present

## 2023-03-16 DIAGNOSIS — R200 Anesthesia of skin: Secondary | ICD-10-CM

## 2023-03-16 DIAGNOSIS — M546 Pain in thoracic spine: Secondary | ICD-10-CM

## 2023-03-16 NOTE — Progress Notes (Signed)
Referring Provider Brandi Saint, MD [address] [place],  [place] [address]   CC:  Chief Complaint  Patient presents with   Consult      Brandi Ball is an 32 y.o. female.  HPI: Brandi Ball is a 32 year old female who is referred for evaluation of a possible mass at the base of her neck.  Patient states that she has pain in her neck which often radiates down her spine.  She also relates a history of having fallen on her left outstretched hand and had numbness in the hand.  Since that time she feels that the pain in her neck is worsened and she does endorse numbness in the hand occasionally after sleeping on her left side.  Of note the patient is unable to feel the mass in her neck.  When asked to point to the area of discomfort she points to a spinous process at the top of the thoracic spine  Allergies  Allergen Reactions   Penicillins Hives    Has patient had a PCN reaction causing immediate rash, facial/tongue/throat swelling, SOB or lightheadedness with hypotension: No Has patient had a PCN reaction causing severe rash involving mucus membranes or skin necrosis: No Has patient had a PCN reaction that required hospitalization: No Has patient had a PCN reaction occurring within the last 10 years: No If all of the above answers are "NO", then may proceed with Cephalosporin use.   Latex Itching and Rash    Outpatient Encounter Medications as of 03/16/2023  Medication Sig   albuterol (PROVENTIL HFA;VENTOLIN HFA) 108 (90 Base) MCG/ACT inhaler Inhale 2 puffs into the lungs every 4 (four) hours as needed for wheezing or shortness of breath.   aspirin-acetaminophen-caffeine (EXCEDRIN MIGRAINE) 250-250-65 MG tablet Take 2 tablets by mouth every 6 (six) hours as needed for headache.   beclomethasone (QVAR) 40 MCG/ACT inhaler Inhale 2 puffs into the lungs 2 (two) times daily.   cyclobenzaprine (FLEXERIL) 10 MG tablet Take 1 tablet (10 mg total) by mouth 2 (two) times daily as  needed for muscle spasms.   fexofenadine (ALLEGRA) 180 MG tablet Take 180 mg by mouth daily.   guaiFENesin (MUCINEX) 600 MG 12 hr tablet Take 1 tablet (600 mg total) by mouth 2 (two) times daily.   lidocaine (LIDODERM) 5 % Place 1 patch onto the skin daily. Remove & Discard patch within 12 hours or as directed by MD   loratadine (CLARITIN) 10 MG tablet Take 1 tablet (10 mg total) by mouth daily.   lurasidone (LATUDA) 20 MG TABS tablet Take 1 tablet (20 mg total) by mouth daily.   methocarbamol (ROBAXIN) 500 MG tablet Take 1 tablet (500 mg total) by mouth 2 (two) times daily.   naproxen sodium (ALEVE) 220 MG tablet Take 220 mg by mouth daily as needed.   ondansetron (ZOFRAN) 4 MG tablet Take 1 tablet (4 mg total) by mouth every 8 (eight) hours as needed for nausea or vomiting.   phenazopyridine (PYRIDIUM) 200 MG tablet Take 1 tablet (200 mg total) by mouth 3 (three) times daily.   SUMAtriptan (IMITREX) 50 MG tablet Take 1 tablet (50 mg total) by mouth every 2 (two) hours as needed for migraine. May repeat in 2 hours if headache persists or recurs.   triamcinolone cream (KENALOG) 0.5 % Apply 1 application topically 3 (three) times daily.   No facility-administered encounter medications on file as of 03/16/2023.     Past Medical History:  Diagnosis Date  Asthma    childhood   Bipolar 1 disorder (HCC)    Depression     Past Surgical History:  Procedure Laterality Date   ABDOMINAL SURGERY      No family history on file.  Social History   Social History Narrative   Not on file     Review of Systems General: Denies fevers, chills, weight loss CV: Denies chest pain, shortness of breath, palpitations Neck: Pain at the base of the neck which usually radiates down the spine and may be associated with numbness in the left hand  Physical Exam    03/16/2023    2:06 PM 02/08/2023    2:39 PM 03/28/2022    9:07 AM  Vitals with BMI  Height 5\' 3"   5\' 4"   Weight 190 lbs 13 oz 198 lbs 10 oz  228 lbs 13 oz  BMI 33.81  39.25  Systolic 120 128 295  Diastolic 77 80 72  Pulse 81 74 83    General:  No acute distress,  Alert and oriented, Non-Toxic, Normal speech and affect Neck: I am unable to palpate any type of mass in the area of concern.  I do feel a prominent spinous process.  The patient otherwise has no obvious abnormality. Mammogram: Not applicable Assessment/Plan Neck pain: I cannot palpate a mass in the area of concern.  I have discussed this with the patient and we have agreed to evaluate the area with an MRI.  She will return to see me to discuss results once the study is complete  Brandi Ball 03/16/2023, 5:09 PM

## 2023-03-30 ENCOUNTER — Encounter: Payer: BC Managed Care – PPO | Admitting: Family Medicine

## 2023-05-03 ENCOUNTER — Ambulatory Visit (HOSPITAL_COMMUNITY)
Admission: RE | Admit: 2023-05-03 | Discharge: 2023-05-03 | Disposition: A | Discharge status: Home / Self Care | Payer: BC Managed Care – PPO | Source: Ambulatory Visit | Attending: Plastic Surgery | Admitting: Plastic Surgery

## 2023-05-03 DIAGNOSIS — R52 Pain, unspecified: Secondary | ICD-10-CM | POA: Insufficient documentation

## 2023-05-03 DIAGNOSIS — M5021 Other cervical disc displacement,  high cervical region: Secondary | ICD-10-CM | POA: Diagnosis not present

## 2023-05-03 DIAGNOSIS — M50322 Other cervical disc degeneration at C5-C6 level: Secondary | ICD-10-CM | POA: Diagnosis not present

## 2023-05-03 DIAGNOSIS — M50221 Other cervical disc displacement at C4-C5 level: Secondary | ICD-10-CM | POA: Diagnosis not present

## 2023-05-03 DIAGNOSIS — M50223 Other cervical disc displacement at C6-C7 level: Secondary | ICD-10-CM | POA: Diagnosis not present

## 2023-06-22 ENCOUNTER — Ambulatory Visit (INDEPENDENT_AMBULATORY_CARE_PROVIDER_SITE_OTHER): Payer: Self-pay | Admitting: Family Medicine

## 2023-06-22 ENCOUNTER — Other Ambulatory Visit (HOSPITAL_COMMUNITY)
Admission: RE | Admit: 2023-06-22 | Discharge: 2023-06-22 | Disposition: A | Discharge status: Home / Self Care | Payer: Self-pay | Source: Ambulatory Visit | Attending: Family Medicine | Admitting: Family Medicine

## 2023-06-22 VITALS — BP 110/68 | HR 85 | Temp 98.8°F | Ht 63.0 in | Wt 182.6 lb

## 2023-06-22 DIAGNOSIS — Z113 Encounter for screening for infections with a predominantly sexual mode of transmission: Secondary | ICD-10-CM | POA: Insufficient documentation

## 2023-06-22 DIAGNOSIS — Z23 Encounter for immunization: Secondary | ICD-10-CM

## 2023-06-22 DIAGNOSIS — Z124 Encounter for screening for malignant neoplasm of cervix: Secondary | ICD-10-CM | POA: Insufficient documentation

## 2023-06-22 DIAGNOSIS — H6992 Unspecified Eustachian tube disorder, left ear: Secondary | ICD-10-CM

## 2023-06-22 NOTE — Progress Notes (Signed)
Established Patient Office Visit   Subjective  Patient ID: Brandi Ball, female    DOB: 1991-01-22  Age: 32 y.o. MRN: [id number]  No chief complaint on file.   Pt is a 32 yo female seen for PAP and f/u.  Pt states she is moving to [place] soon and wanted to make sure she was up to date on testing.  Pt notes decreased hearing.  Difficulty hearing music when using ipods.  Also notes occasional itching and fullness/popping in ears left greater than right.  Taking p.o. antihistamines as needed.    Past Medical History:  Diagnosis Date   Asthma    childhood   Bipolar 1 disorder (HCC)    Depression    Past Surgical History:  Procedure Laterality Date   ABDOMINAL SURGERY     Social History   Tobacco Use   Smoking status: Every Day    Current packs/day: 0.50    Types: Cigarettes   Smokeless tobacco: Never  Substance Use Topics   Alcohol use: Yes    Comment: wine   Drug use: No   History reviewed. No pertinent family history. Allergies  Allergen Reactions   Penicillin G Other (See Comments)   Penicillins Hives    Has patient had a PCN reaction causing immediate rash, facial/tongue/throat swelling, SOB or lightheadedness with hypotension: No Has patient had a PCN reaction causing severe rash involving mucus membranes or skin necrosis: No Has patient had a PCN reaction that required hospitalization: No Has patient had a PCN reaction occurring within the last 10 years: No If all of the above answers are "NO", then may proceed with Cephalosporin use.   Latex Itching and Rash      ROS Negative unless stated above    Objective:     BP 110/68 (BP Location: Left Arm, Patient Position: Sitting, Cuff Size: Normal)   Pulse 85   Temp 98.8 F (37.1 C) (Oral)   Ht 5\' 3"  (1.6 m)   Wt 182 lb 9.6 oz (82.8 kg)   LMP 06/01/2023 (Exact Date)   SpO2 97%   BMI 32.35 kg/m    Physical Exam Constitutional:      General: She is not in acute distress.    Appearance: Normal  appearance.  HENT:     Head: Normocephalic and atraumatic.     Right Ear: Tympanic membrane and ear canal normal. Decreased hearing noted.     Left Ear: Tympanic membrane and ear canal normal. Decreased hearing noted.     Nose: Nose normal.     Mouth/Throat:     Mouth: Mucous membranes are moist.  Eyes:     Extraocular Movements: Extraocular movements intact.     Conjunctiva/sclera: Conjunctivae normal.  Cardiovascular:     Rate and Rhythm: Normal rate and regular rhythm.     Heart sounds: Normal heart sounds. No murmur heard.    No gallop.  Pulmonary:     Effort: Pulmonary effort is normal. No respiratory distress.     Breath sounds: Normal breath sounds. No wheezing, rhonchi or rales.  Genitourinary:    General: Normal vulva.     Pubic Area: No rash.      Labia:        Right: No lesion.        Left: No lesion.      Urethra: No prolapse or urethral swelling.     Vagina: Vaginal discharge present.     Cervix: Discharge present.     Uterus: Normal.  Adnexa: Right adnexa normal and left adnexa normal.     Rectum: Normal.  Skin:    General: Skin is warm and dry.  Neurological:     Mental Status: She is alert and oriented to person, place, and time. Mental status is at baseline.      No results found for any visits on 06/22/23.    Assessment & Plan:  Dysfunction of left eustachian tube -OTC po antihistamine, nasal spray prn -given handout  Routine screening for STI (sexually transmitted infection) -     Cytology - PAP -     RPR -     HIV Antibody (routine testing w rflx)  Cervical cancer screening -     Cytology - PAP  Need for influenza vaccination -     Flu vaccine trivalent PF, 6mos and older(Flulaval,Afluria,Fluarix,Fluzone)  Need for Tdap vaccination -     Tdap vaccine greater than or equal to 7yo IM   Return if symptoms worsen or fail to improve.   Deeann Saint, MD

## 2023-06-23 LAB — HIV ANTIBODY (ROUTINE TESTING W REFLEX): HIV 1&2 Ab, 4th Generation: NONREACTIVE

## 2023-06-23 LAB — RPR: RPR Ser Ql: NONREACTIVE

## 2023-06-29 LAB — CYTOLOGY - PAP
Chlamydia: NEGATIVE
Comment: NEGATIVE
Comment: NEGATIVE
Comment: NEGATIVE
Comment: NEGATIVE
Comment: NORMAL
Diagnosis: NEGATIVE
HSV1: NEGATIVE
HSV2: NEGATIVE
High risk HPV: NEGATIVE
Neisseria Gonorrhea: NEGATIVE
Trichomonas: NEGATIVE

## 2023-07-25 IMAGING — CR DG ABDOMEN 1V
1 series · 1 of 1 positions shown · non-contrast
Comparison: CT abdomen and pelvis 02/14/2022

CLINICAL DATA: Abdominal pain and irregular bowel habits.

EXAM:
ABDOMEN - 1 VIEW

[t abdomen supine]
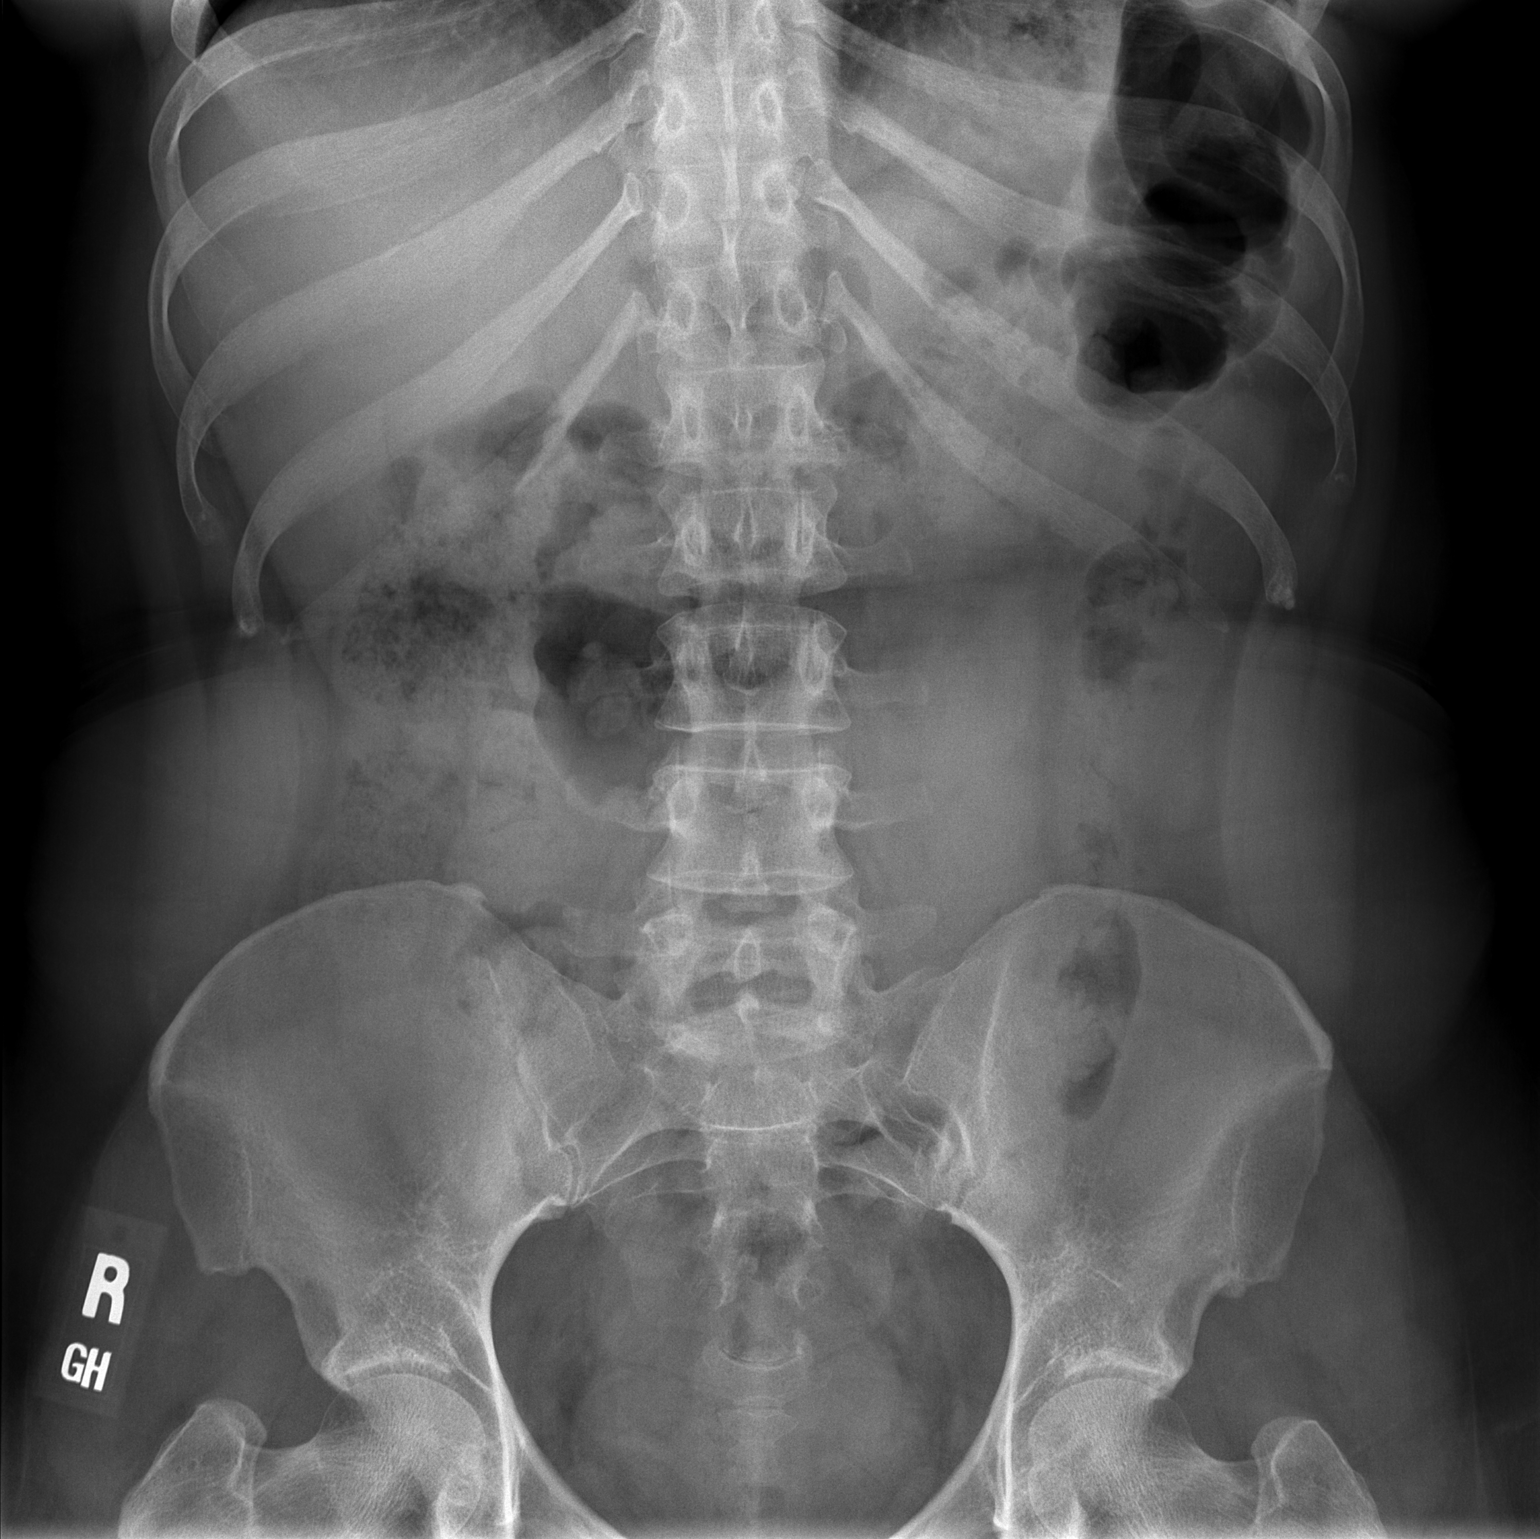

[1 of 1 positions shown; findings below may reference images not displayed]

FINDINGS: The bowel gas pattern is normal. There is moderate stool burden. No
radio-opaque calculi or other significant radiographic abnormality
are seen.
IMPRESSION: Negative.

## 2024-03-04 ENCOUNTER — Other Ambulatory Visit: Payer: Self-pay

## 2024-03-04 ENCOUNTER — Encounter: Payer: Self-pay | Admitting: *Deleted

## 2024-03-04 ENCOUNTER — Ambulatory Visit
Admission: EM | Admit: 2024-03-04 | Discharge: 2024-03-04 | Disposition: A | Discharge status: Home / Self Care | Attending: Physician Assistant | Admitting: Physician Assistant

## 2024-03-04 DIAGNOSIS — J019 Acute sinusitis, unspecified: Secondary | ICD-10-CM | POA: Diagnosis not present

## 2024-03-04 MED ORDER — DOXYCYCLINE HYCLATE 100 MG PO CAPS: 100.0000 mg | ORAL_CAPSULE | Freq: Two times a day (BID) | ORAL | 0 refills | Status: AC

## 2024-03-04 NOTE — ED Provider Notes (Signed)
 [facility]    CSN: [id number] Arrival date & time: 03/04/24  1835      History   Chief Complaint No chief complaint on file.   HPI Brandi Ball is a 33 y.o. female.   HPI  Past Medical History:  Diagnosis Date   Asthma    childhood   Bipolar 1 disorder (HCC)    Depression     Patient Active Problem List   Diagnosis Date Noted   Migraine 06/06/2019   Seasonal allergies 06/06/2019   Cigarette nicotine dependence without complication 06/06/2019   Anxiety and depression 06/06/2019   Moderate persistent asthma without complication 06/06/2019   Irregular menstrual cycle 04/27/2015   Cough 06/06/2014   URI (upper respiratory infection) 06/06/2014    Past Surgical History:  Procedure Laterality Date   ABDOMINAL SURGERY      OB History     Gravida  1   Para  0   Term  0   Preterm  0   AB  1   Living  0      SAB  1   IAB  0   Ectopic  0   Multiple  0   Live Births               Home Medications    Prior to Admission medications   Medication Sig Start Date End Date Taking? Authorizing Provider  albuterol  (PROVENTIL  HFA;VENTOLIN  HFA) 108 (90 Base) MCG/ACT inhaler Inhale 2 puffs into the lungs every 4 (four) hours as needed for wheezing or shortness of breath. 06/23/16   Joy, Shawn C, PA-C  aspirin-acetaminophen -caffeine (EXCEDRIN MIGRAINE) 250-250-65 MG tablet Take 2 tablets by mouth every 6 (six) hours as needed for headache.    [provider]  beclomethasone (QVAR) 40 MCG/ACT inhaler Inhale 2 puffs into the lungs 2 (two) times daily. 06/05/19   Viola Greulich, MD  cyclobenzaprine  (FLEXERIL ) 10 MG tablet Take 1 tablet (10 mg total) by mouth 2 (two) times daily as needed for muscle spasms. 09/16/16   Hedges, Susana Enter, PA-C  fexofenadine (ALLEGRA) 180 MG tablet Take 180 mg by mouth daily.    [provider]  guaiFENesin  (MUCINEX ) 600 MG 12 hr tablet Take 1 tablet (600 mg total) by mouth 2 (two) times daily.  11/18/18   Couture, Cortni S, PA-C  lidocaine  (LIDODERM ) 5 % Place 1 patch onto the skin daily. Remove & Discard patch within 12 hours or as directed by MD 09/23/16   Joy, Shawn C, PA-C  loratadine  (CLARITIN ) 10 MG tablet Take 1 tablet (10 mg total) by mouth daily. 06/05/19   Viola Greulich, MD  lurasidone  (LATUDA ) 20 MG TABS tablet Take 1 tablet (20 mg total) by mouth daily. 03/28/22   Viola Greulich, MD  methocarbamol  (ROBAXIN ) 500 MG tablet Take 1 tablet (500 mg total) by mouth 2 (two) times daily. 09/23/16   Joy, Shawn C, PA-C  naproxen sodium (ALEVE) 220 MG tablet Take 220 mg by mouth daily as needed.    [provider]  ondansetron  (ZOFRAN ) 4 MG tablet Take 1 tablet (4 mg total) by mouth every 8 (eight) hours as needed for nausea or vomiting. 10/23/20   Sponseller, Rebekah R, PA-C  phenazopyridine  (PYRIDIUM ) 200 MG tablet Take 1 tablet (200 mg total) by mouth 3 (three) times daily. 08/01/16   Rasch, Bridgette Campus I, NP  SUMAtriptan  (IMITREX ) 50 MG tablet Take 1 tablet (50 mg total) by mouth every 2 (two) hours as needed  for migraine. May repeat in 2 hours if headache persists or recurs. 06/13/19   Viola Greulich, MD  triamcinolone  cream (KENALOG ) 0.5 % Apply 1 application topically 3 (three) times daily. 08/09/17   Harlee Lichtenstein, CNM    Family History No family history on file.  Social History Social History   Tobacco Use   Smoking status: Every Day    Current packs/day: 0.50    Types: Cigarettes   Smokeless tobacco: Never  Substance Use Topics   Alcohol use: Yes    Comment: wine   Drug use: No     Allergies   Penicillin g, Penicillins, and Latex   Review of Systems Review of Systems  Constitutional:  Negative for chills and fever.  Eyes:  Negative for discharge and redness.  Respiratory:  Positive for shortness of breath.   Gastrointestinal:  Negative for abdominal pain, nausea and vomiting.     Physical Exam Triage Vital Signs ED Triage Vitals  Encounter  Vitals Group     BP      Systolic BP Percentile      Diastolic BP Percentile      Pulse      Resp      Temp      Temp src      SpO2      Weight      Height      Head Circumference      Peak Flow      Pain Score      Pain Loc      Pain Education      Exclude from Growth Chart    No data found.  Updated Vital Signs There were no vitals taken for this visit.  Visual Acuity Right Eye Distance:   Left Eye Distance:   Bilateral Distance:    Right Eye Near:   Left Eye Near:    Bilateral Near:     Physical Exam Vitals and nursing note reviewed.  Constitutional:      General: She is not in acute distress.    Appearance: Normal appearance. She is not ill-appearing.  HENT:     Head: Normocephalic and atraumatic.  Eyes:     Conjunctiva/sclera: Conjunctivae normal.  Cardiovascular:     Rate and Rhythm: Normal rate.  Pulmonary:     Effort: Pulmonary effort is normal.  Neurological:     Mental Status: She is alert.  Psychiatric:        Mood and Affect: Mood normal.        Behavior: Behavior normal.        Thought Content: Thought content normal.      UC Treatments / Results  Labs (all labs ordered are listed, but only abnormal results are displayed) Labs Reviewed - No data to display  EKG   Radiology No results found.  Procedures Procedures (including critical care time)  Medications Ordered in UC Medications - No data to display  Initial Impression / Assessment and Plan / UC Course  I have reviewed the triage vital signs and the nursing notes.  Pertinent labs & imaging results that were available during my care of the patient were reviewed by me and considered in my medical decision making (see chart for details).     *** Final Clinical Impressions(s) / UC Diagnoses   Final diagnoses:  None   Discharge Instructions   None    ED Prescriptions   None    PDMP not reviewed this encounter.

## 2024-03-04 NOTE — ED Triage Notes (Signed)
 Since Friday reports dry cough, head pressure "to the point my eyes hurt", and sometimes "trouble catching my breath". Also c/o pain in neck and shoulders. States she hasn't been taking her meds due to difficulty getting them

## 2024-03-06 ENCOUNTER — Encounter: Payer: Self-pay | Admitting: Physician Assistant

## 2024-04-12 ENCOUNTER — Encounter (HOSPITAL_COMMUNITY): Payer: Self-pay

## 2024-04-12 ENCOUNTER — Emergency Department (HOSPITAL_COMMUNITY)

## 2024-04-12 ENCOUNTER — Other Ambulatory Visit: Payer: Self-pay

## 2024-04-12 ENCOUNTER — Emergency Department (HOSPITAL_COMMUNITY)
Admission: EM | Admit: 2024-04-12 | Discharge: 2024-04-12 | Disposition: A | Discharge status: Home / Self Care | Attending: Emergency Medicine | Admitting: Emergency Medicine

## 2024-04-12 DIAGNOSIS — R55 Syncope and collapse: Secondary | ICD-10-CM | POA: Insufficient documentation

## 2024-04-12 DIAGNOSIS — Z7982 Long term (current) use of aspirin: Secondary | ICD-10-CM | POA: Diagnosis not present

## 2024-04-12 DIAGNOSIS — R42 Dizziness and giddiness: Secondary | ICD-10-CM | POA: Diagnosis present

## 2024-04-12 DIAGNOSIS — Z91040 Latex allergy status: Secondary | ICD-10-CM | POA: Diagnosis not present

## 2024-04-12 HISTORY — DX: Anxiety disorder, unspecified: F41.9

## 2024-04-12 LAB — CBC WITH DIFFERENTIAL/PLATELET
Abs Immature Granulocytes: 0.02 K/uL (ref 0.00–0.07)
Basophils Absolute: 0.1 K/uL (ref 0.0–0.1)
Basophils Relative: 1 %
Eosinophils Absolute: 0.1 K/uL (ref 0.0–0.5)
Eosinophils Relative: 2 %
HCT: 36.9 % (ref 36.0–46.0)
Hemoglobin: 12.3 g/dL (ref 12.0–15.0)
Immature Granulocytes: 0 %
Lymphocytes Relative: 31 %
Lymphs Abs: 1.9 K/uL (ref 0.7–4.0)
MCH: 29.9 pg (ref 26.0–34.0)
MCHC: 33.3 g/dL (ref 30.0–36.0)
MCV: 89.6 fL (ref 80.0–100.0)
Monocytes Absolute: 0.4 K/uL (ref 0.1–1.0)
Monocytes Relative: 7 %
Neutro Abs: 3.7 K/uL (ref 1.7–7.7)
Neutrophils Relative %: 59 %
Platelets: 231 K/uL (ref 150–400)
RBC: 4.12 MIL/uL (ref 3.87–5.11)
RDW: 14.6 % (ref 11.5–15.5)
WBC: 6.3 K/uL (ref 4.0–10.5)
nRBC: 0 % (ref 0.0–0.2)

## 2024-04-12 LAB — BASIC METABOLIC PANEL WITH GFR
Anion gap: 6 (ref 5–15)
BUN: 12 mg/dL (ref 6–20)
CO2: 26 mmol/L (ref 22–32)
Calcium: 8.5 mg/dL — ABNORMAL LOW (ref 8.9–10.3)
Chloride: 106 mmol/L (ref 98–111)
Creatinine, Ser: 0.92 mg/dL (ref 0.44–1.00)
GFR, Estimated: >60 mL/min (ref >60)
Glucose, Bld: 94 mg/dL (ref 70–99)
Potassium: 4.4 mmol/L (ref 3.5–5.1)
Sodium: 138 mmol/L (ref 135–145)

## 2024-04-12 LAB — HCG, SERUM, QUALITATIVE: Preg, Serum: NEGATIVE

## 2024-04-12 LAB — CBG MONITORING, ED: Glucose-Capillary: 94 mg/dL (ref 70–99)

## 2024-04-12 MED ORDER — SODIUM CHLORIDE 0.9 % IV BOLUS
1000.0000 mL | Freq: Once | INTRAVENOUS | Status: AC
  Administered 2024-04-12: 1000 mL via INTRAVENOUS

## 2024-04-12 NOTE — ED Provider Notes (Signed)
 [place] EMERGENCY DEPARTMENT AT [facility] Provider Note   CSN: [id number] Arrival date & time: 04/12/24  9147     Patient presents with: Feeling Shaky   Brandi Ball is a 33 y.o. female.   HPI   Patient has a history of migraines anxiety and depression.  Patient presents to the ED for evaluation after an episode yesterday where she may have had a seizure or nearly passed out.  Patient states she donated plasma yesterday.  She felt fine.  She has done this many times in the past without issue.  She was at home when she suddenly started to feel shaky and lightheaded.  Patient felt like she was going to fall.  She was able to catch herself.  However, during this time patient states she was not able to respond to someone that was talking to her.  This lasted for about 45 seconds.  Patient was worried she may have had a seizure.  She remembers the whole event.  Patient did not want to come to be evaluated yesterday.  She was convinced to come today.  Patient states she still has a mild headache.  She denies any vomiting or diarrhea.  No fevers or chills.  No chest pain or shortness of breath.  Prior to Admission medications   Medication Sig Start Date End Date Taking? Authorizing Provider  albuterol  (PROVENTIL  HFA;VENTOLIN  HFA) 108 (90 Base) MCG/ACT inhaler Inhale 2 puffs into the lungs every 4 (four) hours as needed for wheezing or shortness of breath. Patient not taking: Reported on 03/04/2024 06/23/16   Zada Elouise BROCKS, PA-C  aspirin-acetaminophen -caffeine (EXCEDRIN MIGRAINE) 250-250-65 MG tablet Take 2 tablets by mouth every 6 (six) hours as needed for headache. Patient not taking: Reported on 03/04/2024    [provider]  beclomethasone (QVAR) 40 MCG/ACT inhaler Inhale 2 puffs into the lungs 2 (two) times daily. Patient not taking: Reported on 03/04/2024 06/05/19   Mercer Clotilda SAUNDERS, MD  cyclobenzaprine  (FLEXERIL ) 10 MG tablet Take 1 tablet (10 mg total) by mouth 2  (two) times daily as needed for muscle spasms. Patient not taking: Reported on 03/04/2024 09/16/16   Hedges, Reyes, PA-C  fexofenadine (ALLEGRA) 180 MG tablet Take 180 mg by mouth daily. Patient not taking: Reported on 03/04/2024    [provider]  guaiFENesin  (MUCINEX ) 600 MG 12 hr tablet Take 1 tablet (600 mg total) by mouth 2 (two) times daily. Patient not taking: Reported on 03/04/2024 11/18/18   Couture, Cortni S, PA-C  lidocaine  (LIDODERM ) 5 % Place 1 patch onto the skin daily. Remove & Discard patch within 12 hours or as directed by MD Patient not taking: Reported on 03/04/2024 09/23/16   Joy, Shawn C, PA-C  loratadine  (CLARITIN ) 10 MG tablet Take 1 tablet (10 mg total) by mouth daily. Patient not taking: Reported on 03/04/2024 06/05/19   Mercer Clotilda SAUNDERS, MD  lurasidone  (LATUDA ) 20 MG TABS tablet Take 1 tablet (20 mg total) by mouth daily. Patient not taking: Reported on 03/04/2024 03/28/22   Mercer Clotilda SAUNDERS, MD  methocarbamol  (ROBAXIN ) 500 MG tablet Take 1 tablet (500 mg total) by mouth 2 (two) times daily. Patient not taking: Reported on 03/04/2024 09/23/16   Zada Elouise C, PA-C  naproxen sodium (ALEVE) 220 MG tablet Take 220 mg by mouth daily as needed. Patient not taking: Reported on 03/04/2024    [provider]  ondansetron  (ZOFRAN ) 4 MG tablet Take 1 tablet (4 mg total) by mouth every 8 (eight)  hours as needed for nausea or vomiting. Patient not taking: Reported on 03/04/2024 10/23/20   Sponseller, Rebekah R, PA-C  phenazopyridine  (PYRIDIUM ) 200 MG tablet Take 1 tablet (200 mg total) by mouth 3 (three) times daily. Patient not taking: Reported on 03/04/2024 08/01/16   Rasch, Delon I, NP  SUMAtriptan  (IMITREX ) 50 MG tablet Take 1 tablet (50 mg total) by mouth every 2 (two) hours as needed for migraine. May repeat in 2 hours if headache persists or recurs. Patient not taking: Reported on 03/04/2024 06/13/19   Mercer Clotilda SAUNDERS, MD  triamcinolone  cream (KENALOG ) 0.5 % Apply 1  application topically 3 (three) times daily. Patient not taking: Reported on 03/04/2024 08/09/17   Trudy Earnie CROME, CNM    Allergies: Penicillin g, Penicillins, and Latex    Review of Systems  Updated Vital Signs BP 131/86   Pulse 84   Temp 98.8 F (37.1 C) (Oral)   Resp 19   Ht 1.6 m (5' 3)   Wt 72.6 kg   SpO2 100%   BMI 28.34 kg/m   Physical Exam Vitals and nursing note reviewed.  Constitutional:      General: She is not in acute distress.    Appearance: She is well-developed.  HENT:     Head: Normocephalic and atraumatic.     Right Ear: External ear normal.     Left Ear: External ear normal.  Eyes:     General: No visual field deficit or scleral icterus.       Right eye: No discharge.        Left eye: No discharge.     Conjunctiva/sclera: Conjunctivae normal.  Neck:     Trachea: No tracheal deviation.  Cardiovascular:     Rate and Rhythm: Normal rate and regular rhythm.  Pulmonary:     Effort: Pulmonary effort is normal. No respiratory distress.     Breath sounds: Normal breath sounds. No stridor. No wheezing or rales.  Abdominal:     General: Bowel sounds are normal. There is no distension.     Palpations: Abdomen is soft.     Tenderness: There is no abdominal tenderness. There is no guarding or rebound.  Musculoskeletal:        General: No tenderness.     Cervical back: Neck supple.  Skin:    General: Skin is warm and dry.     Findings: No rash.  Neurological:     Mental Status: She is alert and oriented to person, place, and time.     Cranial Nerves: No cranial nerve deficit, dysarthria or facial asymmetry.     Sensory: No sensory deficit.     Motor: No abnormal muscle tone, seizure activity or pronator drift.     Coordination: Coordination normal.     Comments:  able to hold both legs off bed for 5 seconds, sensation intact in all extremities,  no left or right sided neglect,  no nystagmus noted   Psychiatric:        Mood and Affect: Mood normal.      (all labs ordered are listed, but only abnormal results are displayed) Labs Reviewed  BASIC METABOLIC PANEL WITH GFR - Abnormal; Notable for the following components:      Result Value   Calcium 8.5 (*)    All other components within normal limits  CBC WITH DIFFERENTIAL/PLATELET  HCG, SERUM, QUALITATIVE  CBG MONITORING, ED    EKG: None  Radiology: CT Head Wo Contrast Result Date: 04/12/2024 CLINICAL DATA:  New onset seizure.  Lightheadedness. EXAM: CT HEAD WITHOUT CONTRAST TECHNIQUE: Contiguous axial images were obtained from the base of the skull through the vertex without intravenous contrast. RADIATION DOSE REDUCTION: This exam was performed according to the departmental dose-optimization program which includes automated exposure control, adjustment of the mA and/or kV according to patient size and/or use of iterative reconstruction technique. COMPARISON:  None Available. FINDINGS: Brain: There is no evidence for acute hemorrhage, hydrocephalus, mass lesion, or abnormal extra-axial fluid collection. No definite CT evidence for acute infarction. Vascular: No hyperdense vessel or unexpected calcification. Skull: No evidence for fracture. No worrisome lytic or sclerotic lesion. Sinuses/Orbits: The visualized paranasal sinuses and mastoid air cells are clear. Visualized portions of the globes and intraorbital fat are unremarkable. Other: None. IMPRESSION: No acute intracranial abnormality. Electronically Signed   By: Camellia Candle M.D.   On: 04/12/2024 09:48     Procedures   Medications Ordered in the ED  sodium chloride  0.9 % bolus 1,000 mL (1,000 mLs Intravenous New Bag/Given 04/12/24 1008)    Clinical Course as of 04/12/24 1059  Fri Apr 12, 2024  1021 CBC WITH DIFFERENTIAL Normal [JK]  1043 Basic metabolic panel(!) Normal.  Pregnancy test negative [JK]  1043 CT without acute abnormalities [JK]    Clinical Course User Index [JK] Randol Simmonds, MD                                  Medical Decision Making Problems Addressed: Syncope, unspecified syncope type: acute illness or injury that poses a threat to life or bodily functions  Amount and/or Complexity of Data Reviewed Labs: ordered. Decision-making details documented in ED Course. Radiology: ordered and independent interpretation performed.   Patient presented to the ED for evaluation after an episode of shaking and nearly passing out.  Patient states symptoms occurred after plasma donation although she is never had issues in the past.  Patient did have a prodrome of feeling lightheaded and shaky.  Patient remembers the entire episode.  She states she was not able to speak initially there is no description of a postictal period.  Patient's ED workup is reassuring.  No signs of anemia.  No electrolyte abnormalities.  No abnormalities noted on head CT.  Overall I suspect a syncopal episode rather than seizure.  Discussed this with the patient.  It is possible her plasma donation contributed to orthostatic hypotension and syncope.  Patient is asymptomatic today.  Encouraged continued hydration.  Will have her follow-up with her PCP next week to be rechecked.     Final diagnoses:  Syncope, unspecified syncope type    ED Discharge Orders     None          Randol Simmonds, MD 04/12/24 1059

## 2024-04-12 NOTE — Discharge Instructions (Signed)
 Drink plenty of fluids.  Follow-up with your doctor next week as we discussed to be rechecked.  Return to the ED for recurrent episodes

## 2024-04-12 NOTE — ED Triage Notes (Addendum)
 Patient said yesterday she was feeling lightheaded, and began shaking and then caught herself before she fell. Was hearing someone talk to her but could not respond with words. Did not black out. Occurred for 45 seconds. No history of seizures. First time this has ever happened. Donated plasma yesterday morning. Then this incident happened after.

## 2024-04-12 NOTE — ED Notes (Signed)
 Pt transported to CT ?

## 2024-06-27 ENCOUNTER — Other Ambulatory Visit: Payer: Self-pay | Admitting: Medical Genetics

## 2024-07-04 ENCOUNTER — Ambulatory Visit: Admitting: Family Medicine

## 2024-07-04 VITALS — BP 110/70 | HR 65 | Temp 98.8°F | Ht 63.0 in | Wt 178.6 lb

## 2024-07-04 DIAGNOSIS — H6993 Unspecified Eustachian tube disorder, bilateral: Secondary | ICD-10-CM | POA: Diagnosis not present

## 2024-07-04 DIAGNOSIS — F319 Bipolar disorder, unspecified: Secondary | ICD-10-CM

## 2024-07-04 DIAGNOSIS — Z111 Encounter for screening for respiratory tuberculosis: Secondary | ICD-10-CM

## 2024-07-04 DIAGNOSIS — Z3009 Encounter for other general counseling and advice on contraception: Secondary | ICD-10-CM | POA: Diagnosis not present

## 2024-07-04 DIAGNOSIS — Z23 Encounter for immunization: Secondary | ICD-10-CM | POA: Diagnosis not present

## 2024-07-04 DIAGNOSIS — Z113 Encounter for screening for infections with a predominantly sexual mode of transmission: Secondary | ICD-10-CM | POA: Diagnosis not present

## 2024-07-04 LAB — POCT URINE PREGNANCY: Preg Test, Ur: NEGATIVE

## 2024-07-04 NOTE — Progress Notes (Signed)
 Established Patient Office Visit   Subjective  Patient ID: Brandi Ball, female    DOB: 1991-09-25  Age: 33 y.o. MRN: [id number]  Chief Complaint  Patient presents with   Medical Management of Chronic Issues    Patient came in today for Birth Control, and STD testing     Pt is a 33 yo female seen for f/u and STI testing.  Pt interested in restarting birth control.  Does not feel like she will remember to take a pill daily.   In the past depo caused wt gain.  Patient mentions ears feeling clogged and strain of neck times several days.  Was unsure of slept wrong.  Has not tried anything for symptoms.    Patient Active Problem List   Diagnosis Date Noted   Migraine 06/06/2019   Seasonal allergies 06/06/2019   Cigarette nicotine dependence without complication 06/06/2019   Anxiety and depression 06/06/2019   Moderate persistent asthma without complication 06/06/2019   Irregular menstrual cycle 04/27/2015   Cough 06/06/2014   URI (upper respiratory infection) 06/06/2014   Past Medical History:  Diagnosis Date   Allergy    Anxiety    Asthma    childhood   Bipolar 1 disorder (HCC)    Depression    Past Surgical History:  Procedure Laterality Date   ABDOMINAL SURGERY     Social History   Tobacco Use   Smoking status: Every Day    Current packs/day: 0.50    Types: Cigarettes   Smokeless tobacco: Never  Vaping Use   Vaping status: Some Days  Substance Use Topics   Alcohol use: Yes    Comment: social   Drug use: Yes    Types: Marijuana   History reviewed. No pertinent family history. Allergies  Allergen Reactions   Penicillin G Other (See Comments)   Penicillins Hives    Has patient had a PCN reaction causing immediate rash, facial/tongue/throat swelling, SOB or lightheadedness with hypotension: No Has patient had a PCN reaction causing severe rash involving mucus membranes or skin necrosis: No Has patient had a PCN reaction that required hospitalization:  No Has patient had a PCN reaction occurring within the last 10 years: No If all of the above answers are NO, then may proceed with Cephalosporin use.   Latex Itching and Rash    ROS Negative unless stated above    Objective:     BP 110/70 (BP Location: Left Arm, Patient Position: Sitting, Cuff Size: Large)   Pulse 65   Temp 98.8 F (37.1 C) (Oral)   Ht 5' 3 (1.6 m)   Wt 178 lb 9.6 oz (81 kg)   LMP 06/29/2024 (Exact Date)   SpO2 99%   BMI 31.64 kg/m  BP Readings from Last 3 Encounters:  07/04/24 110/70  04/12/24 131/86  03/04/24 112/74   Wt Readings from Last 3 Encounters:  07/04/24 178 lb 9.6 oz (81 kg)  04/12/24 160 lb (72.6 kg)  06/22/23 182 lb 9.6 oz (82.8 kg)      Physical Exam Constitutional:      General: She is not in acute distress.    Appearance: Normal appearance.  HENT:     Head: Normocephalic and atraumatic.     Right Ear: Hearing, tympanic membrane, ear canal and external ear normal. No middle ear effusion. There is no impacted cerumen. Tympanic membrane is not erythematous or bulging.     Left Ear: Hearing, tympanic membrane, ear canal and external ear normal.  No  middle ear effusion. There is no impacted cerumen. Tympanic membrane is not erythematous or bulging.     Nose: Nose normal.     Mouth/Throat:     Mouth: Mucous membranes are moist.  Cardiovascular:     Rate and Rhythm: Normal rate and regular rhythm.     Heart sounds: Normal heart sounds. No murmur heard.    No gallop.  Pulmonary:     Effort: Pulmonary effort is normal. No respiratory distress.     Breath sounds: Normal breath sounds. No wheezing, rhonchi or rales.  Skin:    General: Skin is warm and dry.  Neurological:     Mental Status: She is alert and oriented to person, place, and time.        07/04/2024   12:16 PM 06/22/2023    3:25 PM 02/08/2023    4:12 PM  Depression screen PHQ 2/9  Decreased Interest 2 2 1   Down, Depressed, Hopeless 3 2 2   PHQ - 2 Score 5 4 3    Altered sleeping 2 3 3   Tired, decreased energy 2 2 3   Change in appetite 1 2 2   Feeling bad or failure about yourself  2 1 2   Trouble concentrating 1 0 1  Moving slowly or fidgety/restless 0 1 0  Suicidal thoughts 0  1  PHQ-9 Score 13 13 15   Difficult doing work/chores Somewhat difficult Somewhat difficult Somewhat difficult      07/04/2024   12:16 PM 06/22/2023    3:26 PM 02/08/2023    4:13 PM 03/25/2021    9:22 AM  GAD 7 : Generalized Anxiety Score  Nervous, Anxious, on Edge 2 1 2  0  Control/stop worrying 2 2 3 2   Worry too much - different things 3 1 2 2   Trouble relaxing 2 1 1  0  Restless 3 2 2 1   Easily annoyed or irritable 1 1 2 1   Afraid - awful might happen 1 1 1 3   Total GAD 7 Score 14 9 13 9   Anxiety Difficulty Somewhat difficult Somewhat difficult Somewhat difficult Somewhat difficult     No results found for any visits on 07/04/24.    Assessment & Plan:   Birth control counseling -     POCT urine pregnancy  Bipolar depression (HCC)  Need for influenza vaccination -     Flu vaccine trivalent PF, 6mos and older(Flulaval,Afluria,Fluarix,Fluzone)  Routine screening for STI (sexually transmitted infection) -     HIV Antibody (routine testing w rflx); Future -     RPR; Future -     C. trachomatis/N. gonorrhoeae RNA; Future  Encounter for TB test -     QuantiFERON-TB Gold Plus; Future  Dysfunction of both eustachian tubes  Birth control counseling.  Patient wishes to have Nexplanon placed.  Information regarding implant reviewed.  Will order device and schedule placement in the next few weeks.  POC urine pregnancy negative.  Advised to use condoms if sexually active.  STI screening ordered placed.  TB testing was QuantiFERON gold for patient's employer.  Influenza vaccine given this visit.  OTC antihistamine for eustachian tube dysfunction likely from seasonal allergies.  History of bipolar depression, stable per pt.  Consider restarting meds for increased  symptoms and counseling.  OTC antihistamine for eustachian tube dysfunction.  No follow-ups on file.   Brandi Ball Single, MD

## 2024-07-05 LAB — C. TRACHOMATIS/N. GONORRHOEAE RNA
C. trachomatis RNA, TMA: NOT DETECTED
N. gonorrhoeae RNA, TMA: NOT DETECTED

## 2024-07-07 LAB — HIV ANTIBODY (ROUTINE TESTING W REFLEX)
HIV 1&2 Ab, 4th Generation: NONREACTIVE
HIV FINAL INTERPRETATION: NEGATIVE

## 2024-07-07 LAB — QUANTIFERON-TB GOLD PLUS
Mitogen-NIL: 7.95 [IU]/mL
NIL: 0.01 [IU]/mL
QuantiFERON-TB Gold Plus: NEGATIVE
TB1-NIL: 0.01 [IU]/mL
TB2-NIL: 0.01 [IU]/mL

## 2024-07-07 LAB — RPR: RPR Ser Ql: NONREACTIVE

## 2024-07-11 ENCOUNTER — Encounter: Admitting: Family Medicine

## 2024-07-11 ENCOUNTER — Ambulatory Visit: Payer: Self-pay | Admitting: Family Medicine

## 2024-07-18 ENCOUNTER — Encounter: Payer: Self-pay | Admitting: Family Medicine

## 2024-07-18 ENCOUNTER — Ambulatory Visit (INDEPENDENT_AMBULATORY_CARE_PROVIDER_SITE_OTHER): Admitting: Family Medicine

## 2024-07-18 VITALS — BP 120/68 | HR 86 | Temp 97.9°F | Ht 63.0 in | Wt 176.4 lb

## 2024-07-18 DIAGNOSIS — F17210 Nicotine dependence, cigarettes, uncomplicated: Secondary | ICD-10-CM

## 2024-07-18 DIAGNOSIS — Z30017 Encounter for initial prescription of implantable subdermal contraceptive: Secondary | ICD-10-CM | POA: Diagnosis not present

## 2024-07-18 LAB — POCT URINE PREGNANCY: Preg Test, Ur: NEGATIVE

## 2024-07-18 MED ORDER — ETONOGESTREL 68 MG ~~LOC~~ IMPL: 68.0000 mg | DRUG_IMPLANT | Freq: Once | SUBCUTANEOUS | Status: AC

## 2024-07-18 NOTE — Progress Notes (Signed)
 Established Patient Office Visit   Subjective  Patient ID: Brandi Ball, female    DOB: 09-29-1991  Age: 33 y.o. MRN: [id number]  Chief Complaint  Patient presents with   Medical Management of Chronic Issues    Birth control placement     Pt is a 33 yo female seen for Nexplanon placement.  Pt previously tried Depo.  LMP 06/29/24.  Pt working on smoking cessation.  Currently smoking 1/2 ppd.  States doing so more out of habit than need.  Quit in the past.  Hasn't considered quit aids.    Patient Active Problem List   Diagnosis Date Noted   Migraine 06/06/2019   Seasonal allergies 06/06/2019   Cigarette nicotine dependence without complication 06/06/2019   Anxiety and depression 06/06/2019   Moderate persistent asthma without complication 06/06/2019   Irregular menstrual cycle 04/27/2015   Cough 06/06/2014   URI (upper respiratory infection) 06/06/2014   Past Medical History:  Diagnosis Date   Allergy    Anxiety    Asthma    childhood   Bipolar 1 disorder (HCC)    Depression    Past Surgical History:  Procedure Laterality Date   ABDOMINAL SURGERY     Social History   Tobacco Use   Smoking status: Every Day    Current packs/day: 0.50    Types: Cigarettes   Smokeless tobacco: Never  Vaping Use   Vaping status: Some Days  Substance Use Topics   Alcohol use: Yes    Comment: social   Drug use: Yes    Types: Marijuana   History reviewed. No pertinent family history. Allergies  Allergen Reactions   Penicillin G Other (See Comments)   Penicillins Hives    Has patient had a PCN reaction causing immediate rash, facial/tongue/throat swelling, SOB or lightheadedness with hypotension: No Has patient had a PCN reaction causing severe rash involving mucus membranes or skin necrosis: No Has patient had a PCN reaction that required hospitalization: No Has patient had a PCN reaction occurring within the last 10 years: No If all of the above answers are NO, then  may proceed with Cephalosporin use.   Latex Itching and Rash    ROS Negative unless stated above    Objective:     BP 120/68 (BP Location: Left Arm, Patient Position: Sitting, Cuff Size: Large)   Pulse 86   Temp 97.9 F (36.6 C) (Oral)   Ht 5' 3 (1.6 m)   Wt 176 lb 6.4 oz (80 kg)   LMP 06/29/2024 (Exact Date)   SpO2 99%   BMI 31.25 kg/m  BP Readings from Last 3 Encounters:  07/18/24 120/68  07/04/24 110/70  04/12/24 131/86   Wt Readings from Last 3 Encounters:  07/18/24 176 lb 6.4 oz (80 kg)  07/04/24 178 lb 9.6 oz (81 kg)  04/12/24 160 lb (72.6 kg)      Physical Exam Constitutional:      General: She is not in acute distress.    Appearance: Normal appearance.  HENT:     Head: Normocephalic and atraumatic.     Nose: Nose normal.     Mouth/Throat:     Mouth: Mucous membranes are moist.  Cardiovascular:     Rate and Rhythm: Normal rate and regular rhythm.     Heart sounds: Normal heart sounds. No murmur heard.    No gallop.  Pulmonary:     Effort: Pulmonary effort is normal. No respiratory distress.     Breath sounds: Normal  breath sounds. No wheezing, rhonchi or rales.  Skin:    General: Skin is warm and dry.  Neurological:     Mental Status: She is alert and oriented to person, place, and time.     PROCEDURE NOTE: NEXPLANON  INSERTION Patient given informed consent and signed copy in the chart. Left arm area prepped and draped in the usual sterile fashion. Lidocaine  with epinephrine 2% used for local anesthesia.  Insertion area marked 8-10 cm from medial epicondyle.  Nexplanon insertion device used to deploy implant. A small bandage was applied over a steri strip  Pt tolerated procedure well.  No complications. Patient given follow up instructions should she experience redness, swelling at sight or fever in the next 24 hours. Implant card given to patient.      07/04/2024   12:16 PM 06/22/2023    3:25 PM 02/08/2023    4:12 PM  Depression screen PHQ 2/9   Decreased Interest 2 2 1   Down, Depressed, Hopeless 3 2 2   PHQ - 2 Score 5 4 3   Altered sleeping 2 3 3   Tired, decreased energy 2 2 3   Change in appetite 1 2 2   Feeling bad or failure about yourself  2 1 2   Trouble concentrating 1 0 1  Moving slowly or fidgety/restless 0 1 0  Suicidal thoughts 0  1  PHQ-9 Score 13 13 15   Difficult doing work/chores Somewhat difficult Somewhat difficult Somewhat difficult      07/04/2024   12:16 PM 06/22/2023    3:26 PM 02/08/2023    4:13 PM 03/25/2021    9:22 AM  GAD 7 : Generalized Anxiety Score  Nervous, Anxious, on Edge 2 1 2  0  Control/stop worrying 2 2 3 2   Worry too much - different things 3 1 2 2   Trouble relaxing 2 1 1  0  Restless 3 2 2 1   Easily annoyed or irritable 1 1 2 1   Afraid - awful might happen 1 1 1 3   Total GAD 7 Score 14 9 13 9   Anxiety Difficulty Somewhat difficult Somewhat difficult Somewhat difficult Somewhat difficult     Results for orders placed or performed in visit on 07/18/24  POCT urine pregnancy  Result Value Ref Range   Preg Test, Ur Negative Negative      Assessment & Plan:   Encounter for initial prescription of implantable subdermal contraceptive -     Etonogestrel -     POCT urine pregnancy  Cigarette nicotine dependence without complication   Consent obtained.  R/b/a discussed.  POC UhCG negative.  Nexplanon placed.  Patient tolerated procedure well.  Given information card. Smoking cessation strongly encouraged. Offered quit aids.  Pt declines at this time.  Continue to assess at each OFV.  Return if symptoms worsen or fail to improve.   Clotilda JONELLE Single, MD

## 2024-08-01 ENCOUNTER — Other Ambulatory Visit

## 2024-08-05 ENCOUNTER — Encounter: Payer: Self-pay | Admitting: Family Medicine

## 2024-10-15 DIAGNOSIS — Z006 Encounter for examination for normal comparison and control in clinical research program: Secondary | ICD-10-CM
